# Patient Record
Sex: Female | Born: 1945 | Race: White | Hispanic: No | Marital: Married | Smoking: Former smoker
Health system: Southern US, Community
[De-identification: ages and names within clinical notes are randomized; demographics above are authoritative.]

## PROBLEM LIST (undated history)

## (undated) DIAGNOSIS — I219 Acute myocardial infarction, unspecified: Secondary | ICD-10-CM

## (undated) DIAGNOSIS — I10 Essential (primary) hypertension: Secondary | ICD-10-CM

## (undated) DIAGNOSIS — F32A Depression, unspecified: Secondary | ICD-10-CM

## (undated) DIAGNOSIS — F329 Major depressive disorder, single episode, unspecified: Secondary | ICD-10-CM

## (undated) DIAGNOSIS — I213 ST elevation (STEMI) myocardial infarction of unspecified site: Secondary | ICD-10-CM

## (undated) DIAGNOSIS — M1990 Unspecified osteoarthritis, unspecified site: Secondary | ICD-10-CM

## (undated) DIAGNOSIS — F419 Anxiety disorder, unspecified: Secondary | ICD-10-CM

## (undated) DIAGNOSIS — I447 Left bundle-branch block, unspecified: Secondary | ICD-10-CM

## (undated) DIAGNOSIS — G629 Polyneuropathy, unspecified: Secondary | ICD-10-CM

## (undated) DIAGNOSIS — N814 Uterovaginal prolapse, unspecified: Secondary | ICD-10-CM

## (undated) DIAGNOSIS — E785 Hyperlipidemia, unspecified: Secondary | ICD-10-CM

## (undated) DIAGNOSIS — I2510 Atherosclerotic heart disease of native coronary artery without angina pectoris: Secondary | ICD-10-CM

## (undated) DIAGNOSIS — N393 Stress incontinence (female) (male): Secondary | ICD-10-CM

## (undated) DIAGNOSIS — I6523 Occlusion and stenosis of bilateral carotid arteries: Secondary | ICD-10-CM

## (undated) DIAGNOSIS — I8390 Asymptomatic varicose veins of unspecified lower extremity: Secondary | ICD-10-CM

## (undated) DIAGNOSIS — G43909 Migraine, unspecified, not intractable, without status migrainosus: Secondary | ICD-10-CM

## (undated) HISTORY — PX: UMBILICAL HERNIA REPAIR: SHX196

## (undated) HISTORY — PX: CAROTID STENT: SHX1301

## (undated) HISTORY — PX: CHOLECYSTECTOMY: SHX55

## (undated) HISTORY — PX: HERNIA REPAIR: SHX51

## (undated) HISTORY — DX: Essential (primary) hypertension: I10

## (undated) HISTORY — DX: Acute myocardial infarction, unspecified: I21.9

## (undated) HISTORY — DX: Asymptomatic varicose veins of unspecified lower extremity: I83.90

## (undated) HISTORY — PX: CORONARY ANGIOPLASTY: SHX604

## (undated) HISTORY — PX: COLONOSCOPY: SHX174

---
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
## 2004-10-19 ENCOUNTER — Ambulatory Visit: Payer: Self-pay | Admitting: Unknown Physician Specialty

## 2005-12-13 ENCOUNTER — Ambulatory Visit: Payer: Self-pay | Admitting: Unknown Physician Specialty

## 2007-05-08 ENCOUNTER — Ambulatory Visit: Payer: Self-pay | Admitting: Internal Medicine

## 2008-05-11 ENCOUNTER — Ambulatory Visit: Payer: Self-pay | Admitting: Internal Medicine

## 2009-05-15 ENCOUNTER — Ambulatory Visit: Payer: Self-pay | Admitting: Internal Medicine

## 2010-08-17 ENCOUNTER — Ambulatory Visit: Payer: Self-pay | Admitting: Internal Medicine

## 2010-09-18 ENCOUNTER — Ambulatory Visit: Payer: Self-pay | Admitting: Unknown Physician Specialty

## 2011-08-26 ENCOUNTER — Ambulatory Visit: Payer: Self-pay | Admitting: Internal Medicine

## 2012-01-23 ENCOUNTER — Emergency Department: Payer: Self-pay | Admitting: Emergency Medicine

## 2012-01-23 DIAGNOSIS — I2510 Atherosclerotic heart disease of native coronary artery without angina pectoris: Secondary | ICD-10-CM | POA: Diagnosis present

## 2012-01-23 LAB — BASIC METABOLIC PANEL
Anion Gap: 9 (ref 7–16)
BUN: 17 mg/dL (ref 7–18)
Chloride: 104 mmol/L (ref 98–107)
Creatinine: 1.05 mg/dL (ref 0.60–1.30)
EGFR (Non-African Amer.): 55 — ABNORMAL LOW
Osmolality: 282 (ref 275–301)
Sodium: 140 mmol/L (ref 136–145)

## 2012-01-23 LAB — CBC
HCT: 43.2 % (ref 35.0–47.0)
HGB: 14.7 g/dL (ref 12.0–16.0)
MCHC: 34 g/dL (ref 32.0–36.0)
WBC: 7.5 10*3/uL (ref 3.6–11.0)

## 2012-01-23 LAB — CK TOTAL AND CKMB (NOT AT ARMC): CK-MB: 14.8 ng/mL — ABNORMAL HIGH (ref 0.5–3.6)

## 2012-03-23 ENCOUNTER — Encounter: Payer: Self-pay | Admitting: Cardiology

## 2012-04-01 ENCOUNTER — Encounter: Payer: Self-pay | Admitting: Cardiology

## 2012-05-02 ENCOUNTER — Encounter: Payer: Self-pay | Admitting: Cardiology

## 2012-05-30 ENCOUNTER — Encounter: Payer: Self-pay | Admitting: Cardiology

## 2012-06-09 DIAGNOSIS — R319 Hematuria, unspecified: Secondary | ICD-10-CM | POA: Diagnosis present

## 2012-09-01 ENCOUNTER — Ambulatory Visit: Payer: Self-pay | Admitting: Obstetrics & Gynecology

## 2012-09-15 ENCOUNTER — Ambulatory Visit: Payer: Self-pay | Admitting: Internal Medicine

## 2013-09-14 DIAGNOSIS — R7301 Impaired fasting glucose: Secondary | ICD-10-CM | POA: Diagnosis present

## 2013-09-14 DIAGNOSIS — F411 Generalized anxiety disorder: Secondary | ICD-10-CM | POA: Diagnosis present

## 2013-12-16 ENCOUNTER — Ambulatory Visit: Payer: Self-pay | Admitting: Family Medicine

## 2015-01-11 ENCOUNTER — Other Ambulatory Visit: Payer: Self-pay | Admitting: Family Medicine

## 2015-01-11 DIAGNOSIS — Z1231 Encounter for screening mammogram for malignant neoplasm of breast: Secondary | ICD-10-CM

## 2015-01-24 ENCOUNTER — Ambulatory Visit
Admission: RE | Admit: 2015-01-24 | Discharge: 2015-01-24 | Disposition: A | Discharge status: Home / Self Care | Payer: BLUE CROSS/BLUE SHIELD | Source: Ambulatory Visit | Attending: Family Medicine | Admitting: Family Medicine

## 2015-01-24 DIAGNOSIS — Z1231 Encounter for screening mammogram for malignant neoplasm of breast: Secondary | ICD-10-CM | POA: Insufficient documentation

## 2015-12-20 ENCOUNTER — Encounter (INDEPENDENT_AMBULATORY_CARE_PROVIDER_SITE_OTHER): Payer: Self-pay

## 2016-01-22 ENCOUNTER — Other Ambulatory Visit (INDEPENDENT_AMBULATORY_CARE_PROVIDER_SITE_OTHER): Payer: Self-pay | Admitting: Vascular Surgery

## 2016-01-22 DIAGNOSIS — I83813 Varicose veins of bilateral lower extremities with pain: Secondary | ICD-10-CM

## 2016-01-22 DIAGNOSIS — M79605 Pain in left leg: Secondary | ICD-10-CM

## 2016-01-22 DIAGNOSIS — I10 Essential (primary) hypertension: Secondary | ICD-10-CM

## 2016-01-22 DIAGNOSIS — M79604 Pain in right leg: Secondary | ICD-10-CM

## 2016-01-22 DIAGNOSIS — I25119 Atherosclerotic heart disease of native coronary artery with unspecified angina pectoris: Secondary | ICD-10-CM

## 2016-01-23 ENCOUNTER — Ambulatory Visit (INDEPENDENT_AMBULATORY_CARE_PROVIDER_SITE_OTHER): Payer: BLUE CROSS/BLUE SHIELD

## 2016-01-23 ENCOUNTER — Ambulatory Visit (INDEPENDENT_AMBULATORY_CARE_PROVIDER_SITE_OTHER): Payer: BLUE CROSS/BLUE SHIELD | Admitting: Vascular Surgery

## 2016-01-23 ENCOUNTER — Other Ambulatory Visit: Payer: Self-pay | Admitting: Family Medicine

## 2016-01-23 ENCOUNTER — Encounter (INDEPENDENT_AMBULATORY_CARE_PROVIDER_SITE_OTHER): Payer: Self-pay | Admitting: Vascular Surgery

## 2016-01-23 VITALS — BP 154/86 | HR 69 | Resp 17 | Ht 62.0 in | Wt 172.0 lb

## 2016-01-23 DIAGNOSIS — M79604 Pain in right leg: Secondary | ICD-10-CM

## 2016-01-23 DIAGNOSIS — I779 Disorder of arteries and arterioles, unspecified: Secondary | ICD-10-CM

## 2016-01-23 DIAGNOSIS — I739 Peripheral vascular disease, unspecified: Principal | ICD-10-CM

## 2016-01-23 DIAGNOSIS — I10 Essential (primary) hypertension: Secondary | ICD-10-CM

## 2016-01-23 DIAGNOSIS — M79605 Pain in left leg: Secondary | ICD-10-CM | POA: Diagnosis not present

## 2016-01-23 DIAGNOSIS — Z1231 Encounter for screening mammogram for malignant neoplasm of breast: Secondary | ICD-10-CM

## 2016-01-23 DIAGNOSIS — M79609 Pain in unspecified limb: Secondary | ICD-10-CM | POA: Insufficient documentation

## 2016-01-23 DIAGNOSIS — I25119 Atherosclerotic heart disease of native coronary artery with unspecified angina pectoris: Secondary | ICD-10-CM

## 2016-01-23 DIAGNOSIS — I83813 Varicose veins of bilateral lower extremities with pain: Secondary | ICD-10-CM | POA: Diagnosis not present

## 2016-01-23 DIAGNOSIS — M7989 Other specified soft tissue disorders: Secondary | ICD-10-CM | POA: Diagnosis not present

## 2016-01-23 NOTE — Assessment & Plan Note (Signed)
The patient's lower extremity duplex today demonstrates no evidence of deep venous thrombosis, superficial thrombophlebitis, or significant venous reflux in either lower extremity. A reasonably large Baker cyst was seen on the left, and recently she has been having more pain and fullness in her left calf area. It does not appear as if her lower extremity symptoms are related to venous disease. We have recommended continued leg elevation and compression stockings as needed for discomfort.

## 2016-01-23 NOTE — Patient Instructions (Signed)
°Carotid Artery Disease °The carotid arteries are the two main arteries on either side of the neck that supply blood to the brain. Carotid artery disease, also called carotid artery stenosis, is the narrowing or blockage of one or both carotid arteries. Carotid artery disease increases your risk for a stroke or a transient ischemic attack (TIA). A TIA is an episode in which a waxy, fatty substance that accumulates within the artery (plaque) blocks blood flow to the brain. A TIA is considered a "warning stroke."  °CAUSES  °· Buildup of plaque inside the carotid arteries (atherosclerosis) (common). °· A weakened outpouching in an artery (aneurysm). °· Inflammation of the carotid artery (arteritis). °· A fibrous growth within the carotid artery (fibromuscular dysplasia). °· Tissue death within the carotid artery due to radiation treatment (post-radiation necrosis). °· Decreased blood flow due to spasms of the carotid artery (vasospasm). °· Separation of the walls of the carotid artery (carotid dissection). °RISK FACTORS °· High cholesterol (dyslipidemia).   °· High blood pressure (hypertension).   °· Smoking.   °· Obesity.   °· Diabetes.   °· Family history of cardiovascular disease.   °· Inactivity or lack of regular exercise.   °· Being female. Men have an increased risk of developing atherosclerosis earlier in life than women.   °SYMPTOMS  °Carotid artery disease does not cause symptoms. °DIAGNOSIS °Diagnosis of carotid artery disease may include:  °· A physical exam. Your health care provider may hear an abnormal sound (bruit) when listening to the carotid arteries.   °· Specific tests that look at the blood flow in the carotid arteries. These tests include:   °¨ Carotid artery ultrasonography.   °¨ Carotid or cerebral angiography.   °¨ Computerized tomographic angiography (CTA).   °¨ Magnetic resonance angiography (MRA).   °TREATMENT  °Treatment of carotid artery disease can include a combination of treatments.  Treatment options include: °· Surgery. You may have:   °¨ A carotid endarterectomy. This is a surgery to remove the blockages in the carotid arteries.   °¨ A carotid angioplasty with stenting. This is a nonsurgical interventional procedure. A wire mesh (stent) is used to widen the blocked carotid arteries.   °· Medicines to control blood pressure, cholesterol, and reduce blood clotting (antiplatelet therapy).   °· Adjusting your diet.   °· Lifestyle changes such as:   °¨ Quitting smoking.   °¨ Exercising as tolerated or as directed by your health care provider.   °¨ Controlling and maintaining a good blood pressure.   °¨ Keeping cholesterol levels under control.   °HOME CARE INSTRUCTIONS  °· Take medicines only as directed by your health care provider. Make sure you understand all your medicine instructions. Do not stop your medicines without talking to your health care provider.   °· Follow your health care provider's diet instructions. It is important to eat a healthy diet that is low in saturated fats and includes plenty of fresh fruits, vegetables, and lean meats. High-fat, high-sodium foods as well as foods that are fried, overly processed, or have poor nutritional value should be avoided. °· Maintain a healthy weight.   °· Stay physically active. It is recommended that you get at least 30 minutes of activity every day.   °· Do not use any tobacco products including cigarettes, chewing tobacco, or electronic cigarettes. If you need help quitting, ask your health care provider. °· Limit alcohol use to:   °¨ No more than 2 drinks per day for men.   °¨ No more than 1 drink per day for nonpregnant women.   °· Do not use illegal drugs.   °· Keep all follow-up visits as directed by your health   care provider.   °SEEK IMMEDIATE MEDICAL CARE IF:  °You develop TIA or stroke symptoms. These include:  °· Sudden weakness or numbness on one side of the body, such as in the face, arm, or leg.   °· Sudden confusion.    °· Trouble speaking (aphasia) or understanding.   °· Sudden trouble seeing out of one or both eyes.   °· Sudden trouble walking.   °· Dizziness or feeling like you might faint.   °· Loss of balance or coordination.   °· Sudden severe headache with no known cause.   °· Sudden trouble swallowing (dysphagia).   °If you have any of these symptoms, call your local emergency services (911 in [place] Do not drive yourself to the clinic or hospital. This is a medical emergency.  °  °This information is not intended to replace advice given to you by your health care provider. Make sure you discuss any questions you have with your health care provider. °  °Document Released: 06/10/2011 Document Revised: 04/08/2014 Document Reviewed: 09/16/2012 °Elsevier Interactive Patient Education ©2016 [employer] ° °

## 2016-01-23 NOTE — Progress Notes (Signed)
MRN : [id number]  Chelsea Fitzgerald is a 70 y.o. (06/17/1945) female who presents with chief complaint of  Chief Complaint  Patient presents with  . Carotid    Ultrasound follow  .  History of Present Illness: Patient returns today in follow up Today with her vascular studies. The patient's lower extremity duplex today demonstrates no evidence of deep venous thrombosis, superficial thrombophlebitis, or significant venous reflux in either lower extremity. A reasonably large Baker cyst was seen on the left, and recently she has been having more pain and fullness in her left calf area. She does not have ulceration or infection. Her carotid duplex today revealed 40-59% right ICA stenosis and 1-39% left ICA stenosis. She denies any focal neurologic symptoms. Specifically, the patient denies amaurosis fugax, speech or swallowing difficulties, or arm or leg weakness or numbness   Current Outpatient Prescriptions  Medication Sig Dispense Refill  . ALPRAZolam (XANAX) 0.5 MG tablet Take by mouth.    Marland Kitchen aspirin EC 81 MG tablet Take by mouth.    . Cholecalciferol (VITAMIN D-1000 MAX ST) 1000 units tablet Take by mouth.    . citalopram (CELEXA) 40 MG tablet Take 40 mg by mouth daily.  3  . conjugated estrogens (PREMARIN) vaginal cream INSERT 1/4 APPLICATORFUL VAGINALLY TWICE A WEEK AS DIRECTED    . fluticasone (FLONASE) 50 MCG/ACT nasal spray USE 1 SPRAY EACH NOSTRIL ONCE A DAY    . loratadine (CLARITIN) 10 MG tablet Take 10 mg by mouth daily.    Marland Kitchen losartan-hydrochlorothiazide (HYZAAR) 50-12.5 MG tablet TAKE 1 TABLET BY MOUTH ONCE DAILY.    . Melatonin 3 MG TABS Take by mouth.    . metoprolol succinate (TOPROL-XL) 50 MG 24 hr tablet TAKE 1 TABLET (50 MG TOTAL) BY MOUTH ONCE DAILY.    . montelukast (SINGULAIR) 10 MG tablet Take 10 mg by mouth at bedtime.    . Multiple Vitamin (MULTIVITAMIN) capsule Take by mouth.    . Omega-3 1000 MG CAPS Take by mouth.    Marland Kitchen oxymetazoline (AFRIN) 0.05 % nasal spray  Place 1 spray into both nostrils 2 (two) times daily.    . rosuvastatin (CRESTOR) 5 MG tablet Take 5 mg by mouth daily.    . vitamin B-12 (CYANOCOBALAMIN) 100 MCG tablet Take 100 mcg by mouth daily.     No current facility-administered medications for this visit.     Past Medical History:  Diagnosis Date  . Hypertension   . Myocardial infarction   . Varicose veins     Past Surgical History:  Procedure Laterality Date  . CHOLECYSTECTOMY    . HERNIA REPAIR      Social History Social History  Substance Use Topics  . Smoking status: Never Smoker  . Smokeless tobacco: Not on file  . Alcohol use Yes      Family History Family History  Problem Relation Age of Onset  . Breast cancer Sister 26  . Cancer Father   . Hypertension Father      Allergies  Allergen Reactions  . Ace Inhibitors Other (See Comments)    Mouth ulcers  . Lipitor [Atorvastatin]   . Lisinopril   . Simvastatin   . Statins      REVIEW OF SYSTEMS (Negative unless checked)  Constitutional: [] Weight loss  [] Fever  [] Chills Cardiac: [] Chest pain   [] Chest pressure   [] Palpitations   [] Shortness of breath when laying flat   [] Shortness of breath at rest   [] Shortness of breath with  exertion. Vascular:  [] Pain in legs with walking   [x] Pain in legs at rest   [] Pain in legs when laying flat   [] Claudication   [] Pain in feet when walking  [] Pain in feet at rest  [] Pain in feet when laying flat   [] History of DVT   [] Phlebitis   [x] Swelling in legs   [] Varicose veins   [] Non-healing ulcers Pulmonary:   [] Uses home oxygen   [] Productive cough   [] Hemoptysis   [] Wheeze  [] COPD   [] Asthma Neurologic:  [] Dizziness  [] Blackouts   [] Seizures   [] History of stroke   [] History of TIA  [] Aphasia   [] Temporary blindness   [] Dysphagia   [] Weakness or numbness in arms   [] Weakness or numbness in legs Musculoskeletal:  [] Arthritis   [] Joint swelling   [] Joint pain   [] Low back pain Hematologic:  [] Easy bruising  [] Easy  bleeding   [] Hypercoagulable state   [] Anemic   Gastrointestinal:  [] Blood in stool   [] Vomiting blood  [] Gastroesophageal reflux/heartburn   [] Abdominal pain Genitourinary:  [] Chronic kidney disease   [] Difficult urination  [] Frequent urination  [] Burning with urination   [] Hematuria Skin:  [] Rashes   [] Ulcers   [] Wounds Psychological:  [] History of anxiety   []  History of major depression.  Physical Examination  BP (!) 154/86   Pulse 69   Resp 17   Ht 5\' 2"  (1.575 m)   Wt 172 lb (78 kg)   BMI 31.46 kg/m  Gen:  WD/WN, NAD Head: [employer]/AT, No temporalis wasting. Ear/Nose/Throat: Hearing grossly intact, nares w/o erythema or drainage, trachea midline Eyes: Conjunctiva clear. Sclera non-icteric Neck: Supple.  No JVD.  Pulmonary:  Good air movement, no use of accessory muscles.  Cardiac: RRR, normal S1, S2 Vascular:  Vessel Right Left  Radial Palpable Palpable                                   Gastrointestinal: soft, non-tender/non-distended. No guarding/reflex.  Musculoskeletal: M/S 5/5 throughout.  No deformity or atrophy. Trace right lower extremity edema and 1+ left lower extremity edema. Diffuse varicosities present bilaterally Neurologic: Sensation grossly intact in extremities.  Symmetrical.  Speech is fluent.  Psychiatric: Judgment intact, Mood & affect appropriate for pt's clinical situation. Dermatologic: No rashes or ulcers noted.  No cellulitis or open wounds. Lymph : No Cervical, Axillary, or Inguinal lymphadenopathy.      Labs No results found for this or any previous visit (from the past 2160 hour(s)).  Radiology No results found.    Assessment/Plan  Carotid disease, bilateral ([place]) The patient had a duplex today which revealed 40-59% right ICA stenosis and 1-39% left ICA stenosis.  This level of disease is low risk for stroke.  She should continue ASA and Crestor.  Plan follow up duplex in one year.    Essential hypertension blood pressure control  important in reducing the progression of atherosclerotic disease. On appropriate oral medications.   Pain in limb The patient's lower extremity duplex today demonstrates no evidence of deep venous thrombosis, superficial thrombophlebitis, or significant venous reflux in either lower extremity. A reasonably large Baker cyst was seen on the left, and recently she has been having more pain and fullness in her left calf area. It does not appear as if her lower extremity symptoms are related to venous disease. We have recommended continued leg elevation and compression stockings as needed for discomfort.  Swelling of limb The  patient's lower extremity duplex today demonstrates no evidence of deep venous thrombosis, superficial thrombophlebitis, or significant venous reflux in either lower extremity. A reasonably large Baker cyst was seen on the left, and recently she has been having more pain and fullness in her left calf area. It does not appear as if her lower extremity symptoms are related to venous disease. We have recommended continued leg elevation and compression stockings as needed for discomfort.    Leotis Pain, MD  01/23/2016 2:42 PM    This note was created with Dragon medical transcription system.  Any errors from dictation are purely unintentional

## 2016-01-23 NOTE — Assessment & Plan Note (Signed)
blood pressure control important in reducing the progression of atherosclerotic disease. On appropriate oral medications.  

## 2016-01-23 NOTE — Assessment & Plan Note (Signed)
The patient had a duplex today which revealed 40-59% right ICA stenosis and 1-39% left ICA stenosis.  This level of disease is low risk for stroke.  She should continue ASA and Crestor.  Plan follow up duplex in one year.

## 2016-02-27 ENCOUNTER — Ambulatory Visit
Admission: RE | Admit: 2016-02-27 | Discharge: 2016-02-27 | Disposition: A | Discharge status: Home / Self Care | Payer: BLUE CROSS/BLUE SHIELD | Source: Ambulatory Visit | Attending: Family Medicine | Admitting: Family Medicine

## 2016-02-27 DIAGNOSIS — R928 Other abnormal and inconclusive findings on diagnostic imaging of breast: Secondary | ICD-10-CM | POA: Insufficient documentation

## 2016-02-27 DIAGNOSIS — Z1231 Encounter for screening mammogram for malignant neoplasm of breast: Secondary | ICD-10-CM | POA: Insufficient documentation

## 2016-02-29 ENCOUNTER — Other Ambulatory Visit: Payer: Self-pay | Admitting: Family Medicine

## 2016-02-29 DIAGNOSIS — R928 Other abnormal and inconclusive findings on diagnostic imaging of breast: Secondary | ICD-10-CM

## 2016-03-12 ENCOUNTER — Ambulatory Visit
Admission: RE | Admit: 2016-03-12 | Discharge: 2016-03-12 | Disposition: A | Discharge status: Home / Self Care | Payer: BLUE CROSS/BLUE SHIELD | Source: Ambulatory Visit | Attending: Family Medicine | Admitting: Family Medicine

## 2016-03-12 ENCOUNTER — Other Ambulatory Visit: Payer: Self-pay | Admitting: Family Medicine

## 2016-03-12 DIAGNOSIS — R928 Other abnormal and inconclusive findings on diagnostic imaging of breast: Secondary | ICD-10-CM

## 2016-03-12 DIAGNOSIS — N6320 Unspecified lump in the left breast, unspecified quadrant: Secondary | ICD-10-CM

## 2016-03-12 DIAGNOSIS — N6324 Unspecified lump in the left breast, lower inner quadrant: Secondary | ICD-10-CM | POA: Insufficient documentation

## 2016-03-20 ENCOUNTER — Ambulatory Visit
Admission: RE | Admit: 2016-03-20 | Discharge: 2016-03-20 | Disposition: A | Discharge status: Home / Self Care | Payer: BLUE CROSS/BLUE SHIELD | Source: Ambulatory Visit | Attending: Family Medicine | Admitting: Family Medicine

## 2016-03-20 DIAGNOSIS — R928 Other abnormal and inconclusive findings on diagnostic imaging of breast: Secondary | ICD-10-CM | POA: Diagnosis present

## 2016-03-20 DIAGNOSIS — N6320 Unspecified lump in the left breast, unspecified quadrant: Secondary | ICD-10-CM | POA: Insufficient documentation

## 2016-03-20 HISTORY — PX: BREAST BIOPSY: SHX20

## 2016-03-21 LAB — SURGICAL PATHOLOGY

## 2016-09-18 ENCOUNTER — Other Ambulatory Visit: Payer: Self-pay | Admitting: Family Medicine

## 2016-09-18 DIAGNOSIS — N6320 Unspecified lump in the left breast, unspecified quadrant: Secondary | ICD-10-CM

## 2016-10-18 ENCOUNTER — Ambulatory Visit
Admission: RE | Admit: 2016-10-18 | Discharge: 2016-10-18 | Disposition: A | Discharge status: Home / Self Care | Payer: BLUE CROSS/BLUE SHIELD | Source: Ambulatory Visit | Attending: Family Medicine | Admitting: Family Medicine

## 2016-10-18 DIAGNOSIS — N6320 Unspecified lump in the left breast, unspecified quadrant: Secondary | ICD-10-CM

## 2017-01-28 ENCOUNTER — Encounter (INDEPENDENT_AMBULATORY_CARE_PROVIDER_SITE_OTHER): Payer: Self-pay

## 2017-01-28 ENCOUNTER — Ambulatory Visit (INDEPENDENT_AMBULATORY_CARE_PROVIDER_SITE_OTHER): Payer: Self-pay | Admitting: Vascular Surgery

## 2017-05-06 ENCOUNTER — Other Ambulatory Visit: Payer: Self-pay | Admitting: Family Medicine

## 2017-05-06 DIAGNOSIS — Z1231 Encounter for screening mammogram for malignant neoplasm of breast: Secondary | ICD-10-CM

## 2017-05-20 ENCOUNTER — Ambulatory Visit
Admission: RE | Admit: 2017-05-20 | Discharge: 2017-05-20 | Disposition: A | Discharge status: Home / Self Care | Payer: BLUE CROSS/BLUE SHIELD | Source: Ambulatory Visit | Attending: Family Medicine | Admitting: Family Medicine

## 2017-05-20 DIAGNOSIS — Z1231 Encounter for screening mammogram for malignant neoplasm of breast: Secondary | ICD-10-CM | POA: Diagnosis not present

## 2017-05-21 ENCOUNTER — Other Ambulatory Visit: Payer: Self-pay | Admitting: Family Medicine

## 2017-05-21 DIAGNOSIS — R921 Mammographic calcification found on diagnostic imaging of breast: Secondary | ICD-10-CM

## 2017-05-21 DIAGNOSIS — R928 Other abnormal and inconclusive findings on diagnostic imaging of breast: Secondary | ICD-10-CM

## 2017-05-27 ENCOUNTER — Ambulatory Visit
Admission: RE | Admit: 2017-05-27 | Discharge: 2017-05-27 | Disposition: A | Discharge status: Home / Self Care | Payer: BLUE CROSS/BLUE SHIELD | Source: Ambulatory Visit | Attending: Family Medicine | Admitting: Family Medicine

## 2017-05-27 DIAGNOSIS — R928 Other abnormal and inconclusive findings on diagnostic imaging of breast: Secondary | ICD-10-CM

## 2017-05-27 DIAGNOSIS — R921 Mammographic calcification found on diagnostic imaging of breast: Secondary | ICD-10-CM | POA: Diagnosis present

## 2017-05-28 ENCOUNTER — Other Ambulatory Visit: Payer: Self-pay | Admitting: Family Medicine

## 2017-05-28 DIAGNOSIS — R921 Mammographic calcification found on diagnostic imaging of breast: Secondary | ICD-10-CM

## 2017-05-28 DIAGNOSIS — R928 Other abnormal and inconclusive findings on diagnostic imaging of breast: Secondary | ICD-10-CM

## 2017-05-29 ENCOUNTER — Ambulatory Visit
Admission: RE | Admit: 2017-05-29 | Discharge: 2017-05-29 | Disposition: A | Discharge status: Home / Self Care | Payer: BLUE CROSS/BLUE SHIELD | Source: Ambulatory Visit | Attending: Family Medicine | Admitting: Family Medicine

## 2017-05-29 DIAGNOSIS — R921 Mammographic calcification found on diagnostic imaging of breast: Secondary | ICD-10-CM

## 2017-05-29 DIAGNOSIS — R928 Other abnormal and inconclusive findings on diagnostic imaging of breast: Secondary | ICD-10-CM

## 2017-05-29 HISTORY — PX: BREAST BIOPSY: SHX20

## 2017-06-02 LAB — SURGICAL PATHOLOGY

## 2017-08-22 IMAGING — MG MM DIGITAL DIAGNOSTIC UNILAT*L* W/ TOMO W/ CAD
9 series · 9 of 21 positions shown · non-contrast
Comparison: Previous exam(s).

CLINICAL DATA: The patient was called back for a cluster of right
breast masses.

EXAM:
2D DIGITAL DIAGNOSTIC RIGHT MAMMOGRAM WITH CAD AND ADJUNCT TOMO
ULTRASOUND RIGHT BREAST

[L MLO (1 of 2)]
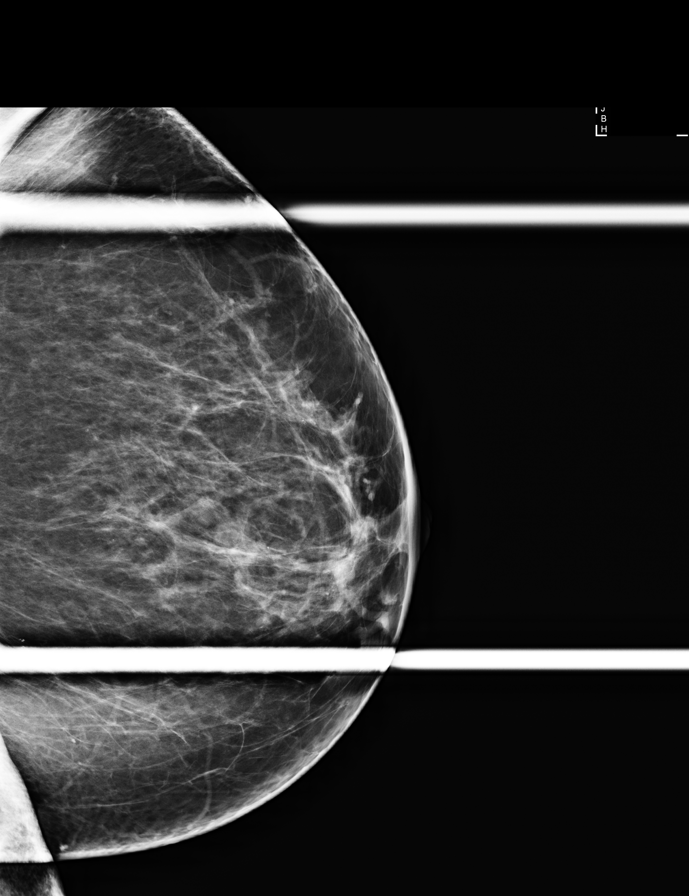

[L CC]
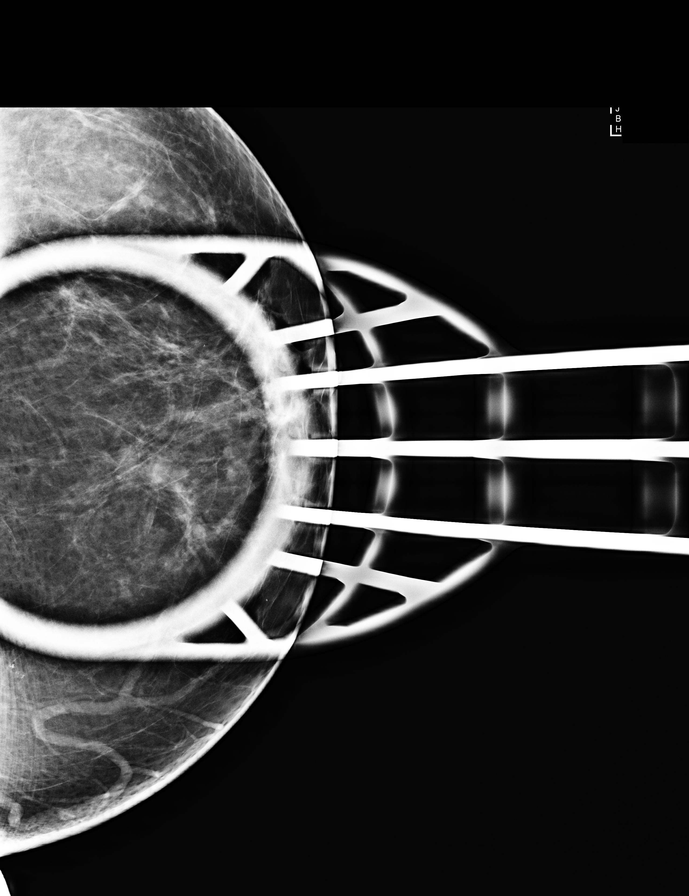

[L MLO synth-2D (1 of 2)]
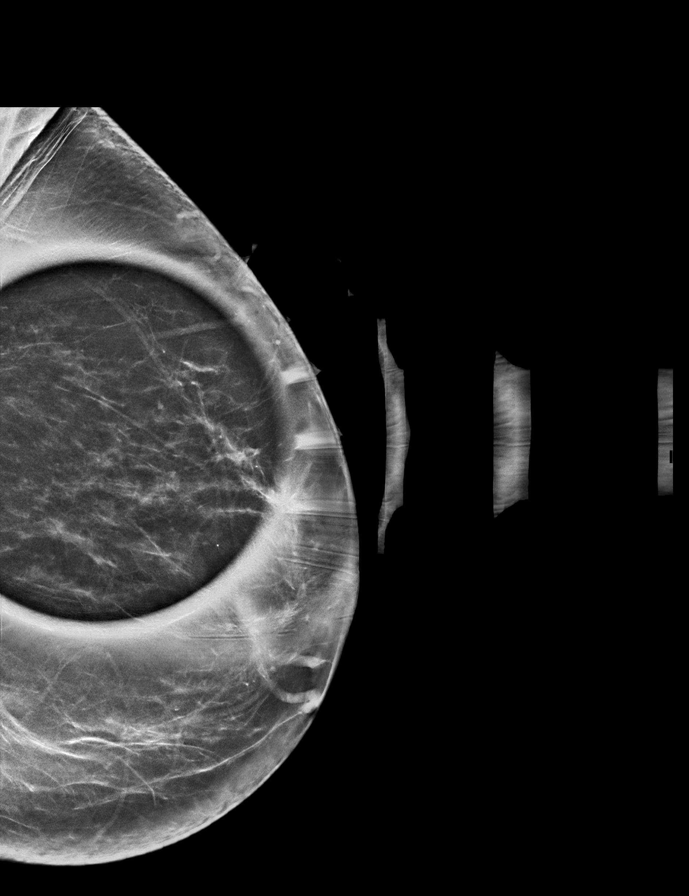

[L CC synth-2D]
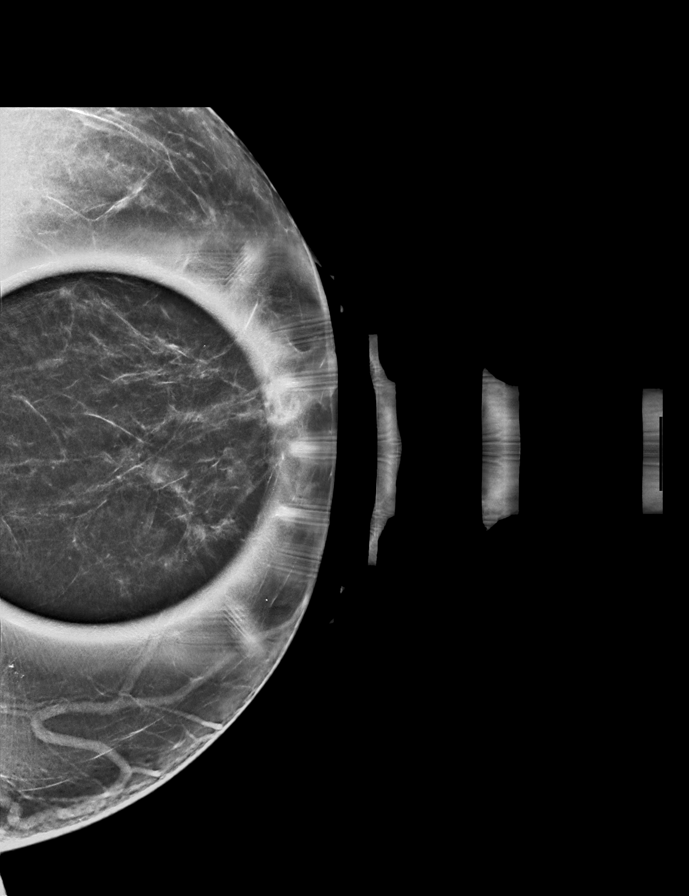

[L MLO synth-2D (2 of 2)]
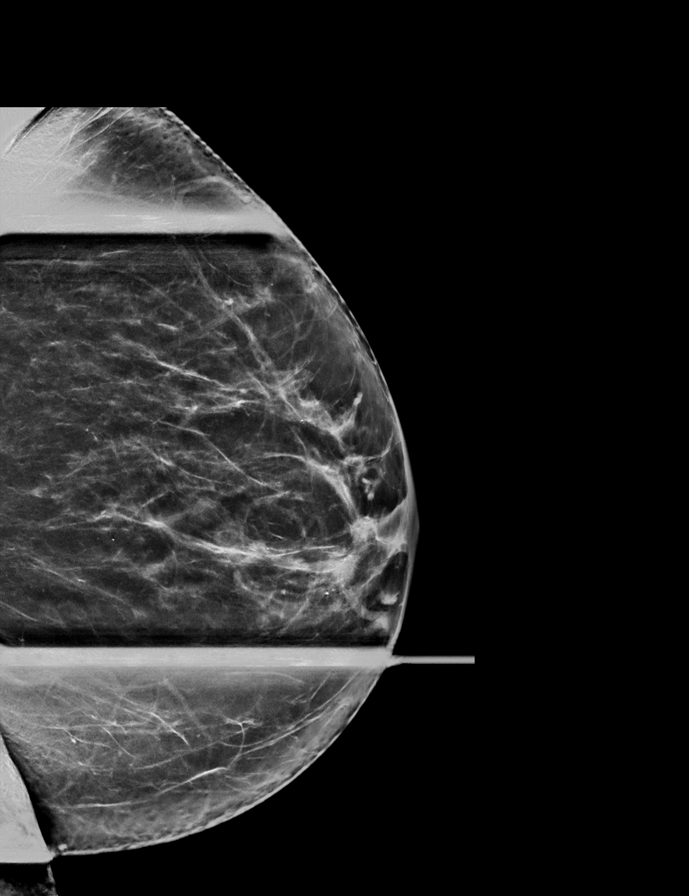

[L MLO (2 of 2)]
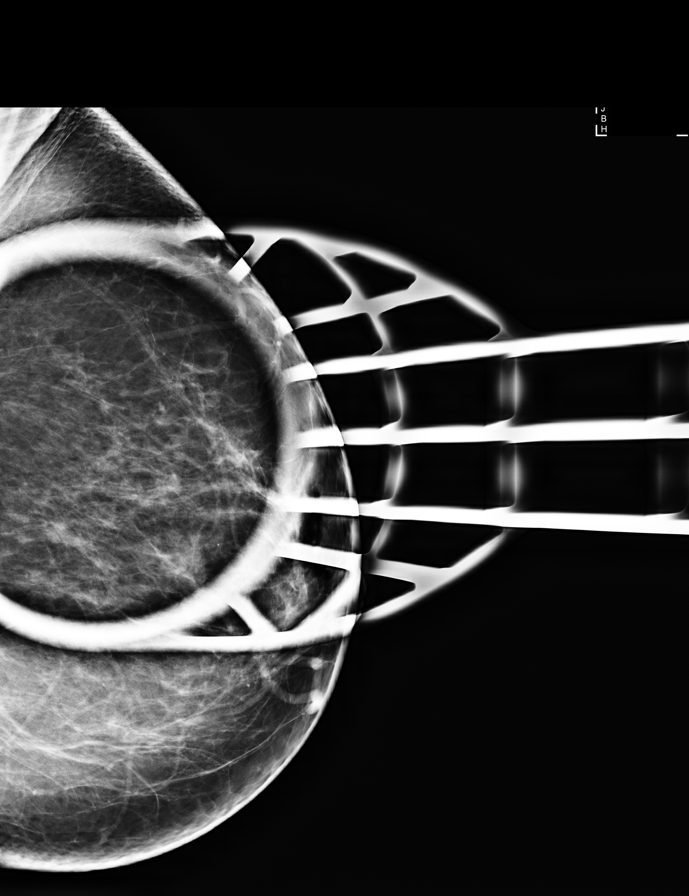

[L MLO tomo (1 of 2) · tomo slice 37/72.0]
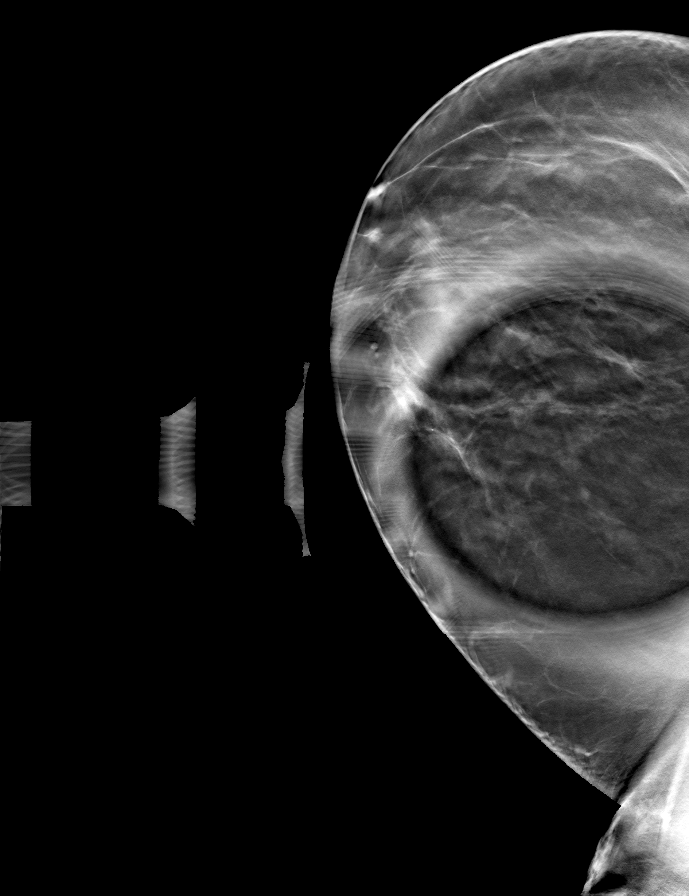

[L CC tomo · tomo slice 31/60.0]
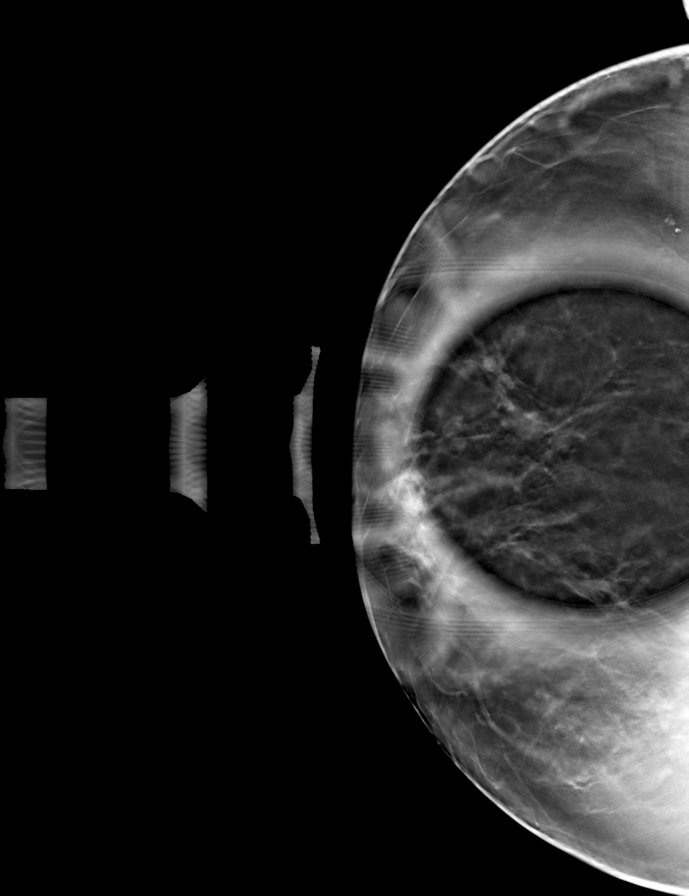

[L MLO tomo (2 of 2) · tomo slice 39/77.0]
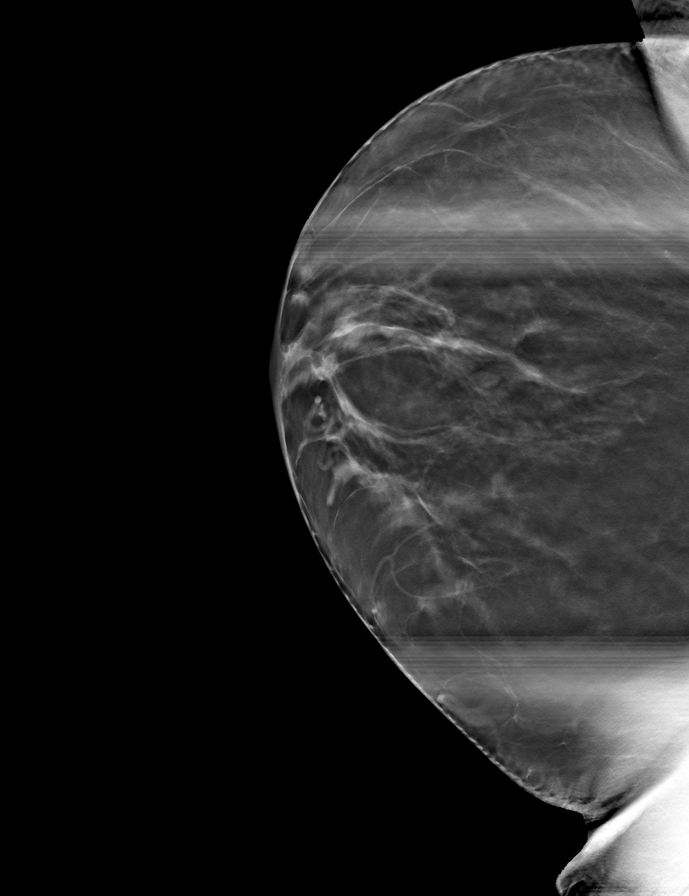

[9 of 21 positions shown; findings below may reference images not displayed]

ACR Breast Density Category b: There are scattered areas of
fibroglandular density.
FINDINGS: The cluster of right breast masses persist on additional imaging. It
is actually most conspicuous on the original study. This is a change
since comparison studies.

Mammographic images were processed with CAD.

On physical exam, no suspicious lumps are identified.

Targeted ultrasound is performed, showing a complex mass at [DATE], 2
cm from the nipple correlating with the mammographic findings. The
mass measures 1.5 x 0.8 x 0.7 cm. No axillary adenopathy.
IMPRESSION: Indeterminate mass in the left breast. The patient does use an
estrogen cream approximately 2 days a week. However, there has been
no change in usage.

RECOMMENDATION:
Ultrasound-guided biopsy of the left breast mass.

I have discussed the findings and recommendations with the patient.
Results were also provided in writing at the conclusion of the
visit. If applicable, a reminder letter will be sent to the patient
regarding the next appointment.

BI-RADS CATEGORY  4: Suspicious.

## 2018-04-23 ENCOUNTER — Other Ambulatory Visit: Payer: Self-pay | Admitting: Family Medicine

## 2018-04-23 DIAGNOSIS — R928 Other abnormal and inconclusive findings on diagnostic imaging of breast: Secondary | ICD-10-CM

## 2018-05-22 ENCOUNTER — Ambulatory Visit
Admission: RE | Admit: 2018-05-22 | Discharge: 2018-05-22 | Disposition: A | Discharge status: Home / Self Care | Payer: BLUE CROSS/BLUE SHIELD | Source: Ambulatory Visit | Attending: Family Medicine | Admitting: Family Medicine

## 2018-05-22 DIAGNOSIS — R928 Other abnormal and inconclusive findings on diagnostic imaging of breast: Secondary | ICD-10-CM | POA: Diagnosis present

## 2018-12-28 DIAGNOSIS — I447 Left bundle-branch block, unspecified: Secondary | ICD-10-CM

## 2018-12-28 HISTORY — DX: Left bundle-branch block, unspecified: I44.7

## 2019-01-08 ENCOUNTER — Encounter (HOSPITAL_COMMUNITY): Payer: Self-pay

## 2019-01-08 ENCOUNTER — Other Ambulatory Visit: Payer: Self-pay | Admitting: Cardiology

## 2019-01-08 DIAGNOSIS — I25118 Atherosclerotic heart disease of native coronary artery with other forms of angina pectoris: Secondary | ICD-10-CM

## 2019-01-08 NOTE — Patient Instructions (Addendum)
DUE TO COVID-19 ONLY ONE VISITOR IS ALLOWED TO COME WITH YOU AND STAY IN THE WAITING ROOM ONLY DURING PRE OP AND PROCEDURE. THE ONE VISITOR MAY VISIT WITH YOU IN YOUR PRIVATE ROOM DURING VISITING HOURS ONLY!!   COVID SWAB TESTING MUST BE COMPLETED ON:  Thursday, Oct. 15, 2020 at    [facility] Entrance [place]. (Must self quarantine after testing. Follow instructions on handout.)       Your procedure is scheduled on: Monday, Oct. 19, 2020   Report to [facility] Main  Entrance    Report to admitting at 11:08 AM   Call this number if you have problems the morning of surgery [phone]   Do not eat food:After Midnight.   May have liquids until 10:30AM day of surgery. Complete one Ensure drink the morning of surgery at  10:30AM the day of surgery.   CLEAR LIQUID DIET  Foods Allowed                                                                     Foods Excluded  Water, Black Coffee and tea, regular and decaf                             liquids that you cannot  Plain Jell-O in any flavor  (No red)                                           see through such as: Fruit ices (not with fruit pulp)                                     milk, soups, orange juice  Iced Popsicles (No red)                                    All solid food Carbonated beverages, regular and diet                                    Apple juices Sports drinks like Gatorade (No red) Lightly seasoned clear broth or consume(fat free) Sugar, honey syrup       Brush your teeth the morning of surgery.     Take these medicines the morning of surgery with A SIP OF WATER: Citalopram, Metoprolol, Rosuvastatin, Trimethoprim   May use Flonase day of surgery                               You may not have any metal on your body including hair pins, jewelry, and body piercings             Do not wear make-up, lotions, powders, perfumes or deodorant             Do not  wear nail polish on fingernails.  Do not shave  48 hours prior to surgery.               Do not bring valuables to the hospital. [place].   Contacts, dentures or bridgework may not be worn into surgery.   Bring small overnight bag day of surgery.    Special Instructions: Bring a copy of your healthcare power of attorney and living will documents         the day of surgery if you haven't scanned them in before.              Please read over the following fact sheets you were given:  [facility] - Preparing for Surgery Before surgery, you can play an important role.  Because skin is not sterile, your skin needs to be as free of germs as possible.  You can reduce the number of germs on your skin by washing with CHG (chlorahexidine gluconate) soap before surgery.  CHG is an antiseptic cleaner which kills germs and bonds with the skin to continue killing germs even after washing. Please DO NOT use if you have an allergy to CHG or antibacterial soaps.  If your skin becomes reddened/irritated stop using the CHG and inform your nurse when you arrive at Short Stay. Do not shave (including legs and underarms) for at least 48 hours prior to the first CHG shower.  You may shave your face/neck.  Please follow these instructions carefully:  1.  Shower with CHG Soap the night before surgery and the  morning of surgery.  2.  If you choose to wash your hair, wash your hair first as usual with your normal  shampoo.  3.  After you shampoo, rinse your hair and body thoroughly to remove the shampoo.                             4.  Use CHG as you would any other liquid soap.  You can apply chg directly to the skin and wash.  Gently with a scrungie or clean washcloth.  5.  Apply the CHG Soap to your body ONLY FROM THE NECK DOWN.   Do   not use on face/ open                           Wound or open sores. Avoid contact with eyes, ears mouth and   genitals (private  parts).                       Wash face,  Genitals (private parts) with your normal soap.             6.  Wash thoroughly, paying special attention to the area where your    surgery  will be performed.  7.  Thoroughly rinse your body with warm water from the neck down.  8.  DO NOT shower/wash with your normal soap after using and rinsing off the CHG Soap.                9.  Pat yourself dry with a clean towel.            10.  Wear clean pajamas.  11.  Place clean sheets on your bed the night of your first shower and do not  sleep with pets. Day of Surgery : Do not apply any lotions/deodorants the morning of surgery.  Please wear clean clothes to the hospital/surgery center.  FAILURE TO FOLLOW THESE INSTRUCTIONS MAY RESULT IN THE CANCELLATION OF YOUR SURGERY  PATIENT SIGNATURE_________________________________  NURSE SIGNATURE__________________________________  ________________________________________________________________________   Chelsea Fitzgerald  An incentive spirometer is a tool that can help keep your lungs clear and active. This tool measures how well you are filling your lungs with each breath. Taking long deep breaths may help reverse or decrease the chance of developing breathing (pulmonary) problems (especially infection) following:  A long period of time when you are unable to move or be active. BEFORE THE PROCEDURE   If the spirometer includes an indicator to show your best effort, your nurse or respiratory therapist will set it to a desired goal.  If possible, sit up straight or lean slightly forward. Try not to slouch.  Hold the incentive spirometer in an upright position. INSTRUCTIONS FOR USE  1. Sit on the edge of your bed if possible, or sit up as far as you can in bed or on a chair. 2. Hold the incentive spirometer in an upright position. 3. Breathe out normally. 4. Place the mouthpiece in your mouth and seal your lips tightly around  it. 5. Breathe in slowly and as deeply as possible, raising the piston or the ball toward the top of the column. 6. Hold your breath for 3-5 seconds or for as long as possible. Allow the piston or ball to fall to the bottom of the column. 7. Remove the mouthpiece from your mouth and breathe out normally. 8. Rest for a few seconds and repeat Steps 1 through 7 at least 10 times every 1-2 hours when you are awake. Take your time and take a few normal breaths between deep breaths. 9. The spirometer may include an indicator to show your best effort. Use the indicator as a goal to work toward during each repetition. 10. After each set of 10 deep breaths, practice coughing to be sure your lungs are clear. If you have an incision (the cut made at the time of surgery), support your incision when coughing by placing a pillow or rolled up towels firmly against it. Once you are able to get out of bed, walk around indoors and cough well. You may stop using the incentive spirometer when instructed by your caregiver.  RISKS AND COMPLICATIONS  Take your time so you do not get dizzy or light-headed.  If you are in pain, you may need to take or ask for pain medication before doing incentive spirometry. It is harder to take a deep breath if you are having pain. AFTER USE  Rest and breathe slowly and easily.  It can be helpful to keep track of a log of your progress. Your caregiver can provide you with a simple table to help with this. If you are using the spirometer at home, follow these instructions: [place] IF:   You are having difficultly using the spirometer.  You have trouble using the spirometer as often as instructed.  Your pain medication is not giving enough relief while using the spirometer.  You develop fever of 100.5 F (38.1 C) or higher. SEEK IMMEDIATE MEDICAL CARE IF:   You cough up bloody sputum that had not been present before.  You develop fever of 102 F (38.9 C)  or  greater.  You develop worsening pain at or near the incision site. MAKE SURE YOU:   Understand these instructions.  Will watch your condition.  Will get help right away if you are not doing well or get worse. Document Released: 07/29/2006 Document Revised: 06/10/2011 Document Reviewed: 09/29/2006 Exit[employer] Patient Information 2014 [employer], [place].   ________________________________________________________________________  WHAT IS A BLOOD TRANSFUSION? Blood Transfusion Information  A transfusion is the replacement of blood or some of its parts. Blood is made up of multiple cells which provide different functions.  Red blood cells carry oxygen and are used for blood loss replacement.  White blood cells fight against infection.  Platelets control bleeding.  Plasma helps clot blood.  Other blood products are available for specialized needs, such as hemophilia or other clotting disorders. BEFORE THE TRANSFUSION  Who gives blood for transfusions?   Healthy volunteers who are fully evaluated to make sure their blood is safe. This is blood bank blood. Transfusion therapy is the safest it has ever been in the practice of medicine. Before blood is taken from a donor, a complete history is taken to make sure that person has no history of diseases nor engages in risky social behavior (examples are intravenous drug use or sexual activity with multiple partners). The donor's travel history is screened to minimize risk of transmitting infections, such as malaria. The donated blood is tested for signs of infectious diseases, such as HIV and hepatitis. The blood is then tested to be sure it is compatible with you in order to minimize the chance of a transfusion reaction. If you or a relative donates blood, this is often done in anticipation of surgery and is not appropriate for emergency situations. It takes many days to process the donated blood. RISKS AND COMPLICATIONS Although transfusion therapy  is very safe and saves many lives, the main dangers of transfusion include:   Getting an infectious disease.  Developing a transfusion reaction. This is an allergic reaction to something in the blood you were given. Every precaution is taken to prevent this. The decision to have a blood transfusion has been considered carefully by your caregiver before blood is given. Blood is not given unless the benefits outweigh the risks. AFTER THE TRANSFUSION  Right after receiving a blood transfusion, you will usually feel much better and more energetic. This is especially true if your red blood cells have gotten low (anemic). The transfusion raises the level of the red blood cells which carry oxygen, and this usually causes an energy increase.  The nurse administering the transfusion will monitor you carefully for complications. HOME CARE INSTRUCTIONS  No special instructions are needed after a transfusion. You may find your energy is better. Speak with your caregiver about any limitations on activity for underlying diseases you may have. SEEK MEDICAL CARE IF:   Your condition is not improving after your transfusion.  You develop redness or irritation at the intravenous (IV) site. SEEK IMMEDIATE MEDICAL CARE IF:  Any of the following symptoms occur over the next 12 hours:  Shaking chills.  You have a temperature by mouth above 102 F (38.9 C), not controlled by medicine.  Chest, back, or muscle pain.  People around you feel you are not acting correctly or are confused.  Shortness of breath or difficulty breathing.  Dizziness and fainting.  You get a rash or develop hives.  You have a decrease in urine output.  Your urine turns a dark color or changes to pink, red,  or brown. Any of the following symptoms occur over the next 10 days:  You have a temperature by mouth above 102 F (38.9 C), not controlled by medicine.  Shortness of breath.  Weakness after normal activity.  The white  part of the eye turns yellow (jaundice).  You have a decrease in the amount of urine or are urinating less often.  Your urine turns a dark color or changes to pink, red, or brown. Document Released: 03/15/2000 Document Revised: 06/10/2011 Document Reviewed: 11/02/2007 [facility] Patient Information 2014 [place], [place].  _______________________________________________________________________

## 2019-01-11 ENCOUNTER — Other Ambulatory Visit: Payer: Self-pay

## 2019-01-11 ENCOUNTER — Encounter (HOSPITAL_COMMUNITY): Payer: Self-pay

## 2019-01-11 ENCOUNTER — Encounter (HOSPITAL_COMMUNITY)
Admission: RE | Admit: 2019-01-11 | Discharge: 2019-01-11 | Disposition: A | Discharge status: Home / Self Care | Payer: BC Managed Care – PPO | Source: Ambulatory Visit | Attending: Orthopedic Surgery | Admitting: Orthopedic Surgery

## 2019-01-11 DIAGNOSIS — Z79899 Other long term (current) drug therapy: Secondary | ICD-10-CM | POA: Diagnosis not present

## 2019-01-11 DIAGNOSIS — Z7982 Long term (current) use of aspirin: Secondary | ICD-10-CM | POA: Diagnosis not present

## 2019-01-11 DIAGNOSIS — Z87891 Personal history of nicotine dependence: Secondary | ICD-10-CM | POA: Diagnosis not present

## 2019-01-11 DIAGNOSIS — I2510 Atherosclerotic heart disease of native coronary artery without angina pectoris: Secondary | ICD-10-CM | POA: Insufficient documentation

## 2019-01-11 DIAGNOSIS — I447 Left bundle-branch block, unspecified: Secondary | ICD-10-CM | POA: Insufficient documentation

## 2019-01-11 DIAGNOSIS — F419 Anxiety disorder, unspecified: Secondary | ICD-10-CM | POA: Insufficient documentation

## 2019-01-11 DIAGNOSIS — Z7901 Long term (current) use of anticoagulants: Secondary | ICD-10-CM | POA: Insufficient documentation

## 2019-01-11 DIAGNOSIS — I10 Essential (primary) hypertension: Secondary | ICD-10-CM | POA: Diagnosis not present

## 2019-01-11 DIAGNOSIS — E785 Hyperlipidemia, unspecified: Secondary | ICD-10-CM | POA: Insufficient documentation

## 2019-01-11 DIAGNOSIS — M1712 Unilateral primary osteoarthritis, left knee: Secondary | ICD-10-CM | POA: Insufficient documentation

## 2019-01-11 DIAGNOSIS — Z01812 Encounter for preprocedural laboratory examination: Secondary | ICD-10-CM | POA: Diagnosis not present

## 2019-01-11 DIAGNOSIS — I252 Old myocardial infarction: Secondary | ICD-10-CM | POA: Insufficient documentation

## 2019-01-11 HISTORY — DX: Polyneuropathy, unspecified: G62.9

## 2019-01-11 HISTORY — DX: Occlusion and stenosis of bilateral carotid arteries: I65.23

## 2019-01-11 HISTORY — DX: ST elevation (STEMI) myocardial infarction of unspecified site: I21.3

## 2019-01-11 HISTORY — DX: Anxiety disorder, unspecified: F41.9

## 2019-01-11 HISTORY — DX: Hyperlipidemia, unspecified: E78.5

## 2019-01-11 HISTORY — DX: Atherosclerotic heart disease of native coronary artery without angina pectoris: I25.10

## 2019-01-11 HISTORY — DX: Unspecified osteoarthritis, unspecified site: M19.90

## 2019-01-11 HISTORY — DX: Depression, unspecified: F32.A

## 2019-01-11 HISTORY — DX: Migraine, unspecified, not intractable, without status migrainosus: G43.909

## 2019-01-11 HISTORY — DX: Stress incontinence (female) (male): N39.3

## 2019-01-11 HISTORY — DX: Uterovaginal prolapse, unspecified: N81.4

## 2019-01-11 LAB — CBC
HCT: 41.3 % (ref 36.0–46.0)
Hemoglobin: 12.9 g/dL (ref 12.0–15.0)
MCH: 30.6 pg (ref 26.0–34.0)
MCHC: 31.2 g/dL (ref 30.0–36.0)
MCV: 97.9 fL (ref 80.0–100.0)
Platelets: 207 10*3/uL (ref 150–400)
RBC: 4.22 MIL/uL (ref 3.87–5.11)
RDW: 14 % (ref 11.5–15.5)
WBC: 6 10*3/uL (ref 4.0–10.5)
nRBC: 0 % (ref 0.0–0.2)

## 2019-01-11 LAB — COMPREHENSIVE METABOLIC PANEL
ALT: 18 U/L (ref 0–44)
AST: 16 U/L (ref 15–41)
Albumin: 3.9 g/dL (ref 3.5–5.0)
Alkaline Phosphatase: 31 U/L — ABNORMAL LOW (ref 38–126)
Anion gap: 7 (ref 5–15)
BUN: 31 mg/dL — ABNORMAL HIGH (ref 8–23)
CO2: 26 mmol/L (ref 22–32)
Calcium: 9 mg/dL (ref 8.9–10.3)
Chloride: 108 mmol/L (ref 98–111)
Creatinine, Ser: 1.37 mg/dL — ABNORMAL HIGH (ref 0.44–1.00)
GFR calc Af Amer: 44 mL/min — ABNORMAL LOW (ref >60)
GFR calc non Af Amer: 38 mL/min — ABNORMAL LOW (ref >60)
Glucose, Bld: 114 mg/dL — ABNORMAL HIGH (ref 70–99)
Potassium: 4.6 mmol/L (ref 3.5–5.1)
Sodium: 141 mmol/L (ref 135–145)
Total Bilirubin: 0.7 mg/dL (ref 0.3–1.2)
Total Protein: 6.8 g/dL (ref 6.5–8.1)

## 2019-01-11 LAB — ABO/RH: ABO/RH(D): O POS

## 2019-01-11 LAB — PROTIME-INR
INR: 1 (ref 0.8–1.2)
Prothrombin Time: 12.9 seconds (ref 11.4–15.2)

## 2019-01-11 LAB — HEMOGLOBIN A1C
Hgb A1c MFr Bld: 6.6 % — ABNORMAL HIGH (ref 4.8–5.6)
Mean Plasma Glucose: 142.72 mg/dL

## 2019-01-11 LAB — SURGICAL PCR SCREEN
MRSA, PCR: NEGATIVE
Staphylococcus aureus: NEGATIVE

## 2019-01-11 LAB — GLUCOSE, CAPILLARY: Glucose-Capillary: 144 mg/dL — ABNORMAL HIGH (ref 70–99)

## 2019-01-13 ENCOUNTER — Other Ambulatory Visit: Payer: Self-pay

## 2019-01-13 ENCOUNTER — Encounter
Admission: RE | Admit: 2019-01-13 | Discharge: 2019-01-13 | Disposition: A | Discharge status: Home / Self Care | Payer: BC Managed Care – PPO | Source: Ambulatory Visit | Attending: Cardiology | Admitting: Cardiology

## 2019-01-13 DIAGNOSIS — I25118 Atherosclerotic heart disease of native coronary artery with other forms of angina pectoris: Secondary | ICD-10-CM | POA: Diagnosis present

## 2019-01-13 MED ORDER — TECHNETIUM TC 99M TETROFOSMIN IV KIT
9.7380 | PACK | Freq: Once | INTRAVENOUS | Status: AC | PRN
  Administered 2019-01-13: 9.738 via INTRAVENOUS

## 2019-01-13 MED ORDER — TECHNETIUM TC 99M TETROFOSMIN IV KIT
30.0000 | PACK | Freq: Once | INTRAVENOUS | Status: AC | PRN
  Administered 2019-01-13: 33.5 via INTRAVENOUS

## 2019-01-13 MED ORDER — REGADENOSON 0.4 MG/5ML IV SOLN
0.4000 mg | Freq: Once | INTRAVENOUS | Status: AC
  Administered 2019-01-13: 0.4 mg via INTRAVENOUS

## 2019-01-13 NOTE — Progress Notes (Addendum)
Anesthesia Chart Review   Case: [id number] Date/Time: 01/18/19 1323   Procedure: Left knee medial unicompartmental arthroplasty (Left Knee) - 25min   Anesthesia type: Choice   Pre-op diagnosis: medial compartment osteoarthritis left knee   Location: [facility] ROOM 09 / [facility]   Surgeon: Gaynelle Arabian, MD      DISCUSSION:72 y.o. former smoker (quit 01/22/12) with h/o HTN, CAD, STEMI s/p PCI 2013, LBBB, carotid stenosis, HLD, medial compartment OA left knee scheduled for above procedure 01/18/2019 with Dr. Gaynelle Arabian.   Pt seen by cardiologist, Dr. Bartholome Bill, 01/07/2019 for preoperative evaluation.  Stress test ordered at this visit for cardiac risk stratification.  Stress test 01/13/2019, results pending.   Addendum:  Stress test 01/13/2019  There was no ST segment deviation noted during stress.  No T wave inversion was noted during stress.  This is a low risk study.  The left ventricular ejection fraction is mildly decreased (45-54%).   Fixed inferolateral defect. Low risk study No evidence of reversible ischemia VS: BP (!) 142/77 (BP Location: Left Arm)   Pulse 70   Temp 36.9 C (Oral)   Resp 18   Ht 5\' 2"  (1.575 m)   Wt 75.8 kg   SpO2 96%   BMI 30.58 kg/m   PROVIDERS: Derinda Late, MD is PCP   Bartholome Bill, MD is Cardiologist  LABS: Labs reviewed: Acceptable for surgery. (all labs ordered are listed, but only abnormal results are displayed)  Labs Reviewed  GLUCOSE, CAPILLARY - Abnormal; Notable for the following components:      Result Value   Glucose-Capillary 144 (*)    All other components within normal limits  COMPREHENSIVE METABOLIC PANEL - Abnormal; Notable for the following components:   Glucose, Bld 114 (*)    BUN 31 (*)    Creatinine, Ser 1.37 (*)    Alkaline Phosphatase 31 (*)    GFR calc non Af Amer 38 (*)    GFR calc Af Amer 44 (*)    All other components within normal limits  HEMOGLOBIN A1C - Abnormal; Notable for the following components:    Hgb A1c MFr Bld 6.6 (*)    All other components within normal limits  SURGICAL PCR SCREEN  CBC  PROTIME-INR  TYPE AND SCREEN  ABO/RH     IMAGES:   EKG: On chart   CV: Echo 01/08/2018 INTERPRETATION MILD LV SYSTOLIC DYSFUNCTION (See above)  WITH MILD LVH NORMAL RIGHT VENTRICULAR SYSTOLIC FUNCTION MILD VALVULAR REGURGITATION (See above) NO VALVULAR STENOSIS MILD MR, TR, PR EF 45% LVH: MILD LVH Closest EF: 45% (Estimated) Contraction: REGIONALLY IMPAIRED Mitral: MILD MR Tricuspid: MILD TR Past Medical History:  Diagnosis Date  . Anxiety   . Arthritis   . Carotid stenosis, bilateral   . Coronary artery disease   . Depression   . Hyperlipidemia   . Hypertension   . LBBB (left bundle branch block) 12/28/2018   Noted on EKG  . Migraines   . Neuropathy   . STEMI (ST elevation myocardial infarction) ([place])   . SUI (stress urinary incontinence, female)   . Uterine prolapse   . Varicose veins     Past Surgical History:  Procedure Laterality Date  . BREAST BIOPSY Left 03/20/2016   FRAGMENT OF CYST WALL.  Marland Kitchen BREAST BIOPSY Right 05/29/2017   ribbon clip, benign  . CAROTID STENT    . CHOLECYSTECTOMY    . COLONOSCOPY    . CORONARY ANGIOPLASTY    . UMBILICAL HERNIA REPAIR  MEDICATIONS: . ALPHA LIPOIC ACID PO  . ALPRAZolam (XANAX) 0.5 MG tablet  . aspirin EC 81 MG tablet  . Cholecalciferol (VITAMIN D-1000 MAX ST) 1000 units tablet  . citalopram (CELEXA) 20 MG tablet  . conjugated estrogens (PREMARIN) vaginal cream  . CRANBERRY PO  . diclofenac (VOLTAREN) 75 MG EC tablet  . ELDERBERRY PO  . fluticasone (FLONASE) 50 MCG/ACT nasal spray  . loratadine (CLARITIN) 10 MG tablet  . losartan-hydrochlorothiazide (HYZAAR) 50-12.5 MG tablet  . Magnesium Oxide 250 MG TABS  . Melatonin 3 MG TABS  . metoprolol succinate (TOPROL-XL) 50 MG 24 hr tablet  . montelukast (SINGULAIR) 10 MG tablet  . Omega-3 Fatty Acids (OMEGA-3 PO)  . polycarbophil (FIBERCON) 625 MG  tablet  . Probiotic Product (PROBIOTIC DAILY PO)  . rosuvastatin (CRESTOR) 5 MG tablet  . trimethoprim (TRIMPEX) 100 MG tablet  . Turmeric Curcumin 500 MG CAPS  . vitamin B-12 (CYANOCOBALAMIN) 100 MCG tablet   No current facility-administered medications for this encounter.     Konrad Felix, PA-C [facility] Pre-Surgical Testing [phone]

## 2019-01-14 ENCOUNTER — Other Ambulatory Visit
Admission: RE | Admit: 2019-01-14 | Discharge: 2019-01-14 | Disposition: A | Discharge status: Home / Self Care | Payer: BC Managed Care – PPO | Source: Ambulatory Visit | Attending: Orthopedic Surgery | Admitting: Orthopedic Surgery

## 2019-01-14 DIAGNOSIS — Z20828 Contact with and (suspected) exposure to other viral communicable diseases: Secondary | ICD-10-CM | POA: Insufficient documentation

## 2019-01-14 DIAGNOSIS — Z01812 Encounter for preprocedural laboratory examination: Secondary | ICD-10-CM | POA: Diagnosis not present

## 2019-01-15 LAB — NM MYOCAR MULTI W/SPECT W/WALL MOTION / EF
LV dias vol: 121 mL (ref 46–106)
LV sys vol: 63 mL
Peak HR: 87 {beats}/min
Rest HR: 61 {beats}/min
SDS: 3
SRS: 19
SSS: 18
TID: 0.95

## 2019-01-15 LAB — SARS CORONAVIRUS 2 (TAT 6-24 HRS): SARS Coronavirus 2: NEGATIVE

## 2019-01-15 NOTE — Anesthesia Preprocedure Evaluation (Addendum)
Anesthesia Evaluation  Patient identified by MRN, date of birth, ID band Patient awake    Reviewed: Allergy & Precautions, NPO status , Patient's Chart, lab work & pertinent test results, reviewed documented beta blocker date and time   Airway Mallampati: II  TM Distance: >3 FB Neck ROM: Full    Dental no notable dental hx.    Pulmonary former smoker,    Pulmonary exam normal breath sounds clear to auscultation       Cardiovascular hypertension, Pt. on medications and Pt. on home beta blockers + CAD, + Past MI and + Cardiac Stents  Normal cardiovascular exam Rhythm:Regular Rate:Normal  There was no ST segment deviation noted during stress. No T wave inversion was noted during stress. This is a low risk study. The left ventricular ejection fraction is mildly decreased (45-54%). Fixed inferolateral defect. Low risk study No evidence of reversible ischemia   Neuro/Psych  Headaches, PSYCHIATRIC DISORDERS Anxiety Depression    GI/Hepatic negative GI ROS, Neg liver ROS,   Endo/Other  negative endocrine ROS  Renal/GU negative Renal ROS     Musculoskeletal  (+) Arthritis , Osteoarthritis,    Abdominal (+) + obese,   Peds  Hematology HLD   Anesthesia Other Findings medial compartment osteoarthritis left knee  Reproductive/Obstetrics                           Anesthesia Physical Anesthesia Plan  ASA: III  Anesthesia Plan: Spinal and Regional   Post-op Pain Management:  Regional for Post-op pain   Induction: Intravenous  PONV Risk Score and Plan: 2 and Ondansetron, Dexamethasone, Treatment may vary due to age or medical condition and Midazolam  Airway Management Planned: Simple Face Mask  Additional Equipment:   Intra-op Plan:   Post-operative Plan:   Informed Consent: I have reviewed the patients History and Physical, chart, labs and discussed the procedure including the risks,  benefits and alternatives for the proposed anesthesia with the patient or authorized representative who has indicated his/her understanding and acceptance.     Dental advisory given  Plan Discussed with: CRNA  Anesthesia Plan Comments: (Reviewed PAT note 01/11/2019, Konrad Felix, PA-C)      Anesthesia Quick Evaluation

## 2019-01-15 NOTE — H&P (Signed)
PARTIAL KNEE ADMISSION H&P  Patient is being admitted for left knee medial unicompartmental arthroplasty.  Subjective:  Chief Complaint:left knee pain.  HPI: Chelsea BRIDENSTINE, 73 y.o. female, has a history of pain and functional disability in the left knee due to arthritis and has failed non-surgical conservative treatments for greater than 12 weeks to includeNSAID's and/or analgesics, corticosteriod injections and activity modification.  Onset of symptoms was gradual, starting 2 years ago with gradually worsening course since that time. The patient noted no past surgery on the left knee(s).  Patient currently rates pain in the left knee(s) at 4 out of 10 with activity. Patient has worsening of pain with activity and weight bearing and pain that interferes with activities of daily living.  Patient has evidence of medial narrowing that is close to bone-on-bone in the left knee. She also has what appears to be a large osteochondral defect on the medial femoral condyle by imaging studies. There is no active infection.  Patient Active Problem List   Diagnosis Date Noted  . Pain in limb 01/23/2016  . Swelling of limb 01/23/2016  . Carotid disease, bilateral ([facility]) 01/23/2016  . Essential hypertension 01/23/2016   Past Medical History:  Diagnosis Date  . Anxiety   . Arthritis   . Carotid stenosis, bilateral   . Coronary artery disease   . Depression   . Hyperlipidemia   . Hypertension   . LBBB (left bundle branch block) 12/28/2018   Noted on EKG  . Migraines   . Neuropathy   . STEMI (ST elevation myocardial infarction) ([facility])   . SUI (stress urinary incontinence, female)   . Uterine prolapse   . Varicose veins     Past Surgical History:  Procedure Laterality Date  . BREAST BIOPSY Left 03/20/2016   FRAGMENT OF CYST WALL.  Marland Kitchen BREAST BIOPSY Right 05/29/2017   ribbon clip, benign  . CAROTID STENT    . CHOLECYSTECTOMY    . COLONOSCOPY    . CORONARY ANGIOPLASTY    . UMBILICAL HERNIA REPAIR       No current facility-administered medications for this encounter.    Current Outpatient Medications  Medication Sig Dispense Refill Last Dose  . ALPHA LIPOIC ACID PO Take 600 mg by mouth daily.     Marland Kitchen ALPRAZolam (XANAX) 0.5 MG tablet Take 0.25 mg by mouth at bedtime.      Marland Kitchen aspirin EC 81 MG tablet Take 81 mg by mouth at bedtime.      . Cholecalciferol (VITAMIN D-1000 MAX ST) 1000 units tablet Take 5,000 Units by mouth daily.      . citalopram (CELEXA) 20 MG tablet Take 20 mg by mouth daily.   3   . conjugated estrogens (PREMARIN) vaginal cream Place 1 Applicatorful vaginally See admin instructions. Every 14 days     . CRANBERRY PO Take 16,800 tablets by mouth daily. Tablets 4800 mg  X 4     . diclofenac (VOLTAREN) 75 MG EC tablet Take 75 mg by mouth daily.     Marland Kitchen ELDERBERRY PO Take 3,500 mg by mouth 2 (two) times daily. Tablets 1250 x 2     . fluticasone (FLONASE) 50 MCG/ACT nasal spray Place 2 sprays into both nostrils daily.      Marland Kitchen loratadine (CLARITIN) 10 MG tablet Take 10 mg by mouth daily as needed for allergies.      Marland Kitchen losartan-hydrochlorothiazide (HYZAAR) 50-12.5 MG tablet Take 1 tablet by mouth daily.      . Magnesium Oxide  250 MG TABS Take 250 mg by mouth daily.     . Melatonin 3 MG TABS Take 3 mg by mouth at bedtime.      . metoprolol succinate (TOPROL-XL) 50 MG 24 hr tablet Take 50 mg by mouth daily.      . montelukast (SINGULAIR) 10 MG tablet Take 10 mg by mouth at bedtime.     . Omega-3 Fatty Acids (OMEGA-3 PO) Take 720 mg by mouth daily. 720 omega 3 2400 fish oil     . polycarbophil (FIBERCON) 625 MG tablet Take 625 mg by mouth daily.     . Probiotic Product (PROBIOTIC DAILY PO) Take 1 tablet by mouth daily. 20 billion      . rosuvastatin (CRESTOR) 5 MG tablet Take 5 mg by mouth daily.     Marland Kitchen trimethoprim (TRIMPEX) 100 MG tablet Take 100 mg by mouth daily.     . Turmeric Curcumin 500 MG CAPS Take 500 mg by mouth 2 (two) times daily.     . vitamin B-12 (CYANOCOBALAMIN) 100  MCG tablet Take 100 mcg by mouth daily.      Allergies  Allergen Reactions  . Ace Inhibitors Other (See Comments)    Mouth ulcers  . Lipitor [Atorvastatin] Diarrhea  . Lisinopril Itching  . Simvastatin Diarrhea  . Statins Diarrhea    Social History   Tobacco Use  . Smoking status: Former Smoker    Packs/day: 1.00    Types: Cigarettes    Quit date: 01/22/2012    Years since quitting: 6.9  . Smokeless tobacco: Never Used  . Tobacco comment: 50   Substance Use Topics  . Alcohol use: Yes    Comment: occas    Family History  Problem Relation Age of Onset  . Breast cancer Sister 15  . Cancer Father   . Hypertension Father   . Breast cancer Daughter 68     Review of Systems  Constitutional: Negative for chills and fever.  Respiratory: Negative for cough and shortness of breath.   Cardiovascular: Negative for chest pain and palpitations.  Gastrointestinal: Negative for nausea and vomiting.  Musculoskeletal: Positive for joint pain.    Objective:  Physical Exam Patient is a 73 year old female.  Well nourished and well developed. General: Alert and oriented x3, cooperative and pleasant, no acute distress. Head: normocephalic, atraumatic, neck supple. Eyes: EOMI. Respiratory: breath sounds clear in all fields, no wheezing, rales, or rhonchi. Cardiovascular: Regular rate and rhythm, no murmurs, gallops or rubs. Abdomen: non-tender to palpation and soft, normoactive bowel sounds.  Musculoskeletal: Right Knee Exam: No effusion. Range of motion is normal. No crepitus on range of motion of the knee. No medial joint line tenderness. No lateral joint line tenderness. Stable knee.  Left Knee Exam: Trace effusion and a very large Baker's cyst present. Range of motion is 0-125 degrees. Minimal crepitus on range of motion of the knee. Severe medial joint line tenderness. No lateral joint line tenderness. Stable knee. Calves soft and nontender. Motor function intact in  LE. Strength 5/5 LE bilaterally. Neuro: Distal pulses 2+. Sensation to light touch intact in LE.  Vital signs in last 24 hours:    Labs:   Estimated body mass index is 30.58 kg/m as calculated from the following:   Height as of 01/11/19: 5\' 2"  (1.575 m).   Weight as of 01/11/19: 75.8 kg.   Imaging Review Plain radiographs demonstrate severe degenerative joint disease of the medial compartment of the left knee(s). The overall alignment isneutral.  The bone quality appears to be adequate for age and reported activity level.  Assessment/Plan:  End stage arthritis, left knee   The patient history, physical examination, clinical judgment of the provider and imaging studies are consistent with end stage degenerative joint disease of the left knee(s) and partial knee arthroplasty is deemed medically necessary. The treatment options including medical management, injection therapy arthroscopy and arthroplasty were discussed at length. The risks and benefits of total knee arthroplasty were presented and reviewed. The risks due to aseptic loosening, infection, stiffness, patella tracking problems, thromboembolic complications and other imponderables were discussed. The patient acknowledged the explanation, agreed to proceed with the plan and consent was signed. Patient is being admitted for inpatient treatment for surgery, pain control, PT, OT, prophylactic antibiotics, VTE prophylaxis, progressive ambulation and ADL's and discharge planning. The patient is planning to be discharged home.  Therapy Plans: outpatient therapy at [facility] in [place] Disposition: Home with husband Planned DVT Prophylaxis: Xarelto 10mg  daily (hx of MI/stents) DME needed: walker PCP: Dr. Baldemar Lenis, clearance received Cardiologist: Dr. Ubaldo Glassing TXA: IV Allergies: lisinopril - itching, statins (at high dose) - diarrhea Anesthesia Concerns: none BMI: 30.7 Last HgbA1c: 6.7%  Other: Hx of MI in 2013, with 1 cardiac  stent. Trimethoprim - maintenance due to regular UTIs.  Instructed patient on which medications to discontinue 5 days prior to surgery. Will follow-up in office with Dr. Wynelle Link 2 weeks post-op.   Patient's anticipated LOS is less than 2 midnights, meeting these requirements: - Younger than 57 - Lives within 1 hour of care - Has a competent adult at home to recover with post-op recover - NO history of  - Chronic pain requiring opiods  - Diabetes  - Coronary Artery Disease  - Heart failure  - Heart attack  - Stroke  - DVT/VTE  - Cardiac arrhythmia  - Respiratory Failure/COPD  - Renal failure  - Anemia  - Advanced Liver disease  - Patient was instructed on what medications to stop prior to surgery. - Follow-up visit in 2 weeks with Dr. Wynelle Link - Begin physical therapy following surgery - Pre-operative lab work as pre-surgical testing - Prescriptions will be provided in hospital at time of discharge  Griffith Citron, PA-C Orthopedic Surgery [facility] [place] [phone]

## 2019-01-17 MED ORDER — BUPIVACAINE LIPOSOME 1.3 % IJ SUSP
20.0000 mL | Freq: Once | INTRAMUSCULAR | Status: DC
  Filled 2019-01-17: qty 20

## 2019-01-18 ENCOUNTER — Encounter (HOSPITAL_COMMUNITY): Payer: Self-pay

## 2019-01-18 ENCOUNTER — Ambulatory Visit (HOSPITAL_COMMUNITY): Payer: BC Managed Care – PPO | Admitting: Certified Registered Nurse Anesthetist

## 2019-01-18 ENCOUNTER — Encounter (HOSPITAL_COMMUNITY)
Admission: RE | Disposition: A | Discharge status: Home / Self Care | Payer: Self-pay | Source: Home / Self Care | Attending: Orthopedic Surgery

## 2019-01-18 ENCOUNTER — Ambulatory Visit (HOSPITAL_COMMUNITY): Payer: BC Managed Care – PPO | Admitting: Physician Assistant

## 2019-01-18 ENCOUNTER — Ambulatory Visit (HOSPITAL_COMMUNITY)
Admission: RE | Admit: 2019-01-18 | Discharge: 2019-01-19 | Disposition: A | Discharge status: Home / Self Care | Payer: BC Managed Care – PPO | Attending: Orthopedic Surgery | Admitting: Orthopedic Surgery

## 2019-01-18 ENCOUNTER — Other Ambulatory Visit: Payer: Self-pay

## 2019-01-18 DIAGNOSIS — E785 Hyperlipidemia, unspecified: Secondary | ICD-10-CM | POA: Insufficient documentation

## 2019-01-18 DIAGNOSIS — M179 Osteoarthritis of knee, unspecified: Secondary | ICD-10-CM | POA: Diagnosis present

## 2019-01-18 DIAGNOSIS — Z79899 Other long term (current) drug therapy: Secondary | ICD-10-CM | POA: Diagnosis not present

## 2019-01-18 DIAGNOSIS — Z955 Presence of coronary angioplasty implant and graft: Secondary | ICD-10-CM | POA: Insufficient documentation

## 2019-01-18 DIAGNOSIS — I252 Old myocardial infarction: Secondary | ICD-10-CM | POA: Insufficient documentation

## 2019-01-18 DIAGNOSIS — M1712 Unilateral primary osteoarthritis, left knee: Secondary | ICD-10-CM | POA: Insufficient documentation

## 2019-01-18 DIAGNOSIS — I10 Essential (primary) hypertension: Secondary | ICD-10-CM | POA: Diagnosis not present

## 2019-01-18 DIAGNOSIS — F419 Anxiety disorder, unspecified: Secondary | ICD-10-CM | POA: Diagnosis not present

## 2019-01-18 DIAGNOSIS — F329 Major depressive disorder, single episode, unspecified: Secondary | ICD-10-CM | POA: Insufficient documentation

## 2019-01-18 DIAGNOSIS — Z79890 Hormone replacement therapy (postmenopausal): Secondary | ICD-10-CM | POA: Diagnosis not present

## 2019-01-18 DIAGNOSIS — Z7982 Long term (current) use of aspirin: Secondary | ICD-10-CM | POA: Diagnosis not present

## 2019-01-18 DIAGNOSIS — Z87891 Personal history of nicotine dependence: Secondary | ICD-10-CM | POA: Diagnosis not present

## 2019-01-18 DIAGNOSIS — I2510 Atherosclerotic heart disease of native coronary artery without angina pectoris: Secondary | ICD-10-CM | POA: Insufficient documentation

## 2019-01-18 DIAGNOSIS — M1710 Unilateral primary osteoarthritis, unspecified knee: Secondary | ICD-10-CM | POA: Diagnosis present

## 2019-01-18 HISTORY — PX: PARTIAL KNEE ARTHROPLASTY: SHX2174

## 2019-01-18 LAB — TYPE AND SCREEN
ABO/RH(D): O POS
Antibody Screen: NEGATIVE

## 2019-01-18 SURGERY — ARTHROPLASTY, KNEE, UNICOMPARTMENTAL
Anesthesia: Regional | Site: Knee | Laterality: Left

## 2019-01-18 MED ORDER — ONDANSETRON HCL 4 MG/2ML IJ SOLN: 4.0000 mg | Freq: Four times a day (QID) | INTRAMUSCULAR | Status: DC | PRN

## 2019-01-18 MED ORDER — ACETAMINOPHEN 500 MG PO TABS
1000.0000 mg | ORAL_TABLET | Freq: Four times a day (QID) | ORAL | Status: DC
  Administered 2019-01-18 – 2019-01-19 (×3): 1000 mg via ORAL
  Filled 2019-01-18 (×4): qty 2

## 2019-01-18 MED ORDER — GABAPENTIN 100 MG PO CAPS
200.0000 mg | ORAL_CAPSULE | Freq: Three times a day (TID) | ORAL | Status: DC
  Administered 2019-01-18 – 2019-01-19 (×2): 200 mg via ORAL
  Filled 2019-01-18 (×2): qty 2

## 2019-01-18 MED ORDER — LIDOCAINE 2% (20 MG/ML) 5 ML SYRINGE
INTRAMUSCULAR | Status: DC | PRN
  Administered 2019-01-18: 40 mg via INTRAVENOUS

## 2019-01-18 MED ORDER — CITALOPRAM HYDROBROMIDE 20 MG PO TABS
20.0000 mg | ORAL_TABLET | Freq: Every day | ORAL | Status: DC
  Administered 2019-01-19: 20 mg via ORAL
  Filled 2019-01-18: qty 1

## 2019-01-18 MED ORDER — RIVAROXABAN 10 MG PO TABS
10.0000 mg | ORAL_TABLET | Freq: Every day | ORAL | Status: DC
  Administered 2019-01-19: 10 mg via ORAL
  Filled 2019-01-18: qty 1

## 2019-01-18 MED ORDER — SODIUM CHLORIDE 0.9 % IV SOLN
INTRAVENOUS | Status: DC
  Administered 2019-01-18: 17:00:00 via INTRAVENOUS

## 2019-01-18 MED ORDER — MONTELUKAST SODIUM 10 MG PO TABS
10.0000 mg | ORAL_TABLET | Freq: Every day | ORAL | Status: DC
  Administered 2019-01-18: 10 mg via ORAL
  Filled 2019-01-18: qty 1

## 2019-01-18 MED ORDER — SODIUM CHLORIDE 0.9 % IR SOLN
Status: DC | PRN
  Administered 2019-01-18: 1000 mL

## 2019-01-18 MED ORDER — BISACODYL 10 MG RE SUPP: 10.0000 mg | Freq: Every day | RECTAL | Status: DC | PRN

## 2019-01-18 MED ORDER — MIDAZOLAM HCL 2 MG/2ML IJ SOLN
1.0000 mg | INTRAMUSCULAR | Status: DC
  Administered 2019-01-18 (×2): 1 mg via INTRAVENOUS
  Filled 2019-01-18: qty 2

## 2019-01-18 MED ORDER — OXYCODONE HCL 5 MG PO TABS: 5.0000 mg | ORAL_TABLET | ORAL | Status: DC | PRN

## 2019-01-18 MED ORDER — BUPIVACAINE LIPOSOME 1.3 % IJ SUSP
INTRAMUSCULAR | Status: DC | PRN
  Administered 2019-01-18: 20 mL

## 2019-01-18 MED ORDER — ROPIVACAINE HCL 5 MG/ML IJ SOLN
INTRAMUSCULAR | Status: DC | PRN
  Administered 2019-01-18: 30 mL via PERINEURAL

## 2019-01-18 MED ORDER — CEFAZOLIN SODIUM-DEXTROSE 2-4 GM/100ML-% IV SOLN
2.0000 g | INTRAVENOUS | Status: AC
  Administered 2019-01-18: 13:00:00 2 g via INTRAVENOUS
  Filled 2019-01-18: qty 100

## 2019-01-18 MED ORDER — METHOCARBAMOL 500 MG IVPB - SIMPLE MED
500.0000 mg | Freq: Four times a day (QID) | INTRAVENOUS | Status: DC | PRN
  Filled 2019-01-18: qty 50

## 2019-01-18 MED ORDER — KETOROLAC TROMETHAMINE 15 MG/ML IJ SOLN: 15.0000 mg | Freq: Once | INTRAMUSCULAR | Status: DC | PRN

## 2019-01-18 MED ORDER — DOCUSATE SODIUM 100 MG PO CAPS
100.0000 mg | ORAL_CAPSULE | Freq: Two times a day (BID) | ORAL | Status: DC
  Administered 2019-01-18 – 2019-01-19 (×2): 100 mg via ORAL
  Filled 2019-01-18 (×2): qty 1

## 2019-01-18 MED ORDER — METOCLOPRAMIDE HCL 5 MG/ML IJ SOLN: 5.0000 mg | Freq: Three times a day (TID) | INTRAMUSCULAR | Status: DC | PRN

## 2019-01-18 MED ORDER — TRIMETHOPRIM 100 MG PO TABS
100.0000 mg | ORAL_TABLET | Freq: Every day | ORAL | Status: DC
  Administered 2019-01-19: 100 mg via ORAL
  Filled 2019-01-18: qty 1

## 2019-01-18 MED ORDER — MENTHOL 3 MG MT LOZG: 1.0000 | LOZENGE | OROMUCOSAL | Status: DC | PRN

## 2019-01-18 MED ORDER — OXYCODONE HCL 5 MG PO TABS
10.0000 mg | ORAL_TABLET | ORAL | Status: DC | PRN
  Administered 2019-01-18 – 2019-01-19 (×2): 10 mg via ORAL
  Filled 2019-01-18 (×2): qty 2

## 2019-01-18 MED ORDER — SODIUM CHLORIDE (PF) 0.9 % IJ SOLN
INTRAMUSCULAR | Status: DC | PRN
  Administered 2019-01-18: 60 mL

## 2019-01-18 MED ORDER — SODIUM CHLORIDE (PF) 0.9 % IJ SOLN
INTRAMUSCULAR | Status: AC
  Filled 2019-01-18: qty 10

## 2019-01-18 MED ORDER — FENTANYL CITRATE (PF) 100 MCG/2ML IJ SOLN
50.0000 ug | INTRAMUSCULAR | Status: DC
  Filled 2019-01-18: qty 2

## 2019-01-18 MED ORDER — SODIUM CHLORIDE (PF) 0.9 % IJ SOLN
INTRAMUSCULAR | Status: AC
  Filled 2019-01-18: qty 50

## 2019-01-18 MED ORDER — LORATADINE 10 MG PO TABS: 10.0000 mg | ORAL_TABLET | Freq: Every day | ORAL | Status: DC | PRN

## 2019-01-18 MED ORDER — CEFAZOLIN SODIUM-DEXTROSE 2-4 GM/100ML-% IV SOLN
2.0000 g | Freq: Four times a day (QID) | INTRAVENOUS | Status: AC
  Administered 2019-01-18 – 2019-01-19 (×2): 2 g via INTRAVENOUS
  Filled 2019-01-18 (×2): qty 100

## 2019-01-18 MED ORDER — MORPHINE SULFATE (PF) 4 MG/ML IV SOLN: 1.0000 mg | INTRAVENOUS | Status: DC | PRN

## 2019-01-18 MED ORDER — HYDROCHLOROTHIAZIDE 12.5 MG PO CAPS
12.5000 mg | ORAL_CAPSULE | Freq: Every day | ORAL | Status: DC
  Administered 2019-01-19: 12.5 mg via ORAL
  Filled 2019-01-18: qty 1

## 2019-01-18 MED ORDER — ONDANSETRON HCL 4 MG/2ML IJ SOLN: 4.0000 mg | Freq: Once | INTRAMUSCULAR | Status: DC | PRN

## 2019-01-18 MED ORDER — CHLORHEXIDINE GLUCONATE 4 % EX LIQD: 60.0000 mL | Freq: Once | CUTANEOUS | Status: DC

## 2019-01-18 MED ORDER — METOCLOPRAMIDE HCL 5 MG PO TABS: 5.0000 mg | ORAL_TABLET | Freq: Three times a day (TID) | ORAL | Status: DC | PRN

## 2019-01-18 MED ORDER — ACETAMINOPHEN 10 MG/ML IV SOLN
1000.0000 mg | Freq: Four times a day (QID) | INTRAVENOUS | Status: DC
  Administered 2019-01-18: 1000 mg via INTRAVENOUS
  Filled 2019-01-18: qty 100

## 2019-01-18 MED ORDER — BUPIVACAINE HCL (PF) 0.75 % IJ SOLN
INTRAMUSCULAR | Status: DC | PRN
  Administered 2019-01-18: 1.6 mL via INTRATHECAL

## 2019-01-18 MED ORDER — LOSARTAN POTASSIUM 50 MG PO TABS
50.0000 mg | ORAL_TABLET | Freq: Every day | ORAL | Status: DC
  Administered 2019-01-19: 50 mg via ORAL
  Filled 2019-01-18: qty 1

## 2019-01-18 MED ORDER — PROPOFOL 10 MG/ML IV BOLUS
INTRAVENOUS | Status: DC | PRN
  Administered 2019-01-18 (×2): 20 mg via INTRAVENOUS

## 2019-01-18 MED ORDER — FLUTICASONE PROPIONATE 50 MCG/ACT NA SUSP
2.0000 | Freq: Every day | NASAL | Status: DC
  Filled 2019-01-18: qty 16

## 2019-01-18 MED ORDER — LOSARTAN POTASSIUM-HCTZ 50-12.5 MG PO TABS: 1.0000 | ORAL_TABLET | Freq: Every day | ORAL | Status: DC

## 2019-01-18 MED ORDER — DEXAMETHASONE SODIUM PHOSPHATE 10 MG/ML IJ SOLN
10.0000 mg | Freq: Once | INTRAMUSCULAR | Status: AC
  Administered 2019-01-19: 10 mg via INTRAVENOUS
  Filled 2019-01-18: qty 1

## 2019-01-18 MED ORDER — FLEET ENEMA 7-19 GM/118ML RE ENEM: 1.0000 | ENEMA | Freq: Once | RECTAL | Status: DC | PRN

## 2019-01-18 MED ORDER — TRANEXAMIC ACID-NACL 1000-0.7 MG/100ML-% IV SOLN
1000.0000 mg | INTRAVENOUS | Status: AC
  Administered 2019-01-18: 1000 mg via INTRAVENOUS
  Filled 2019-01-18: qty 100

## 2019-01-18 MED ORDER — ONDANSETRON HCL 4 MG/2ML IJ SOLN
INTRAMUSCULAR | Status: DC | PRN
  Administered 2019-01-18: 4 mg via INTRAVENOUS

## 2019-01-18 MED ORDER — LACTATED RINGERS IV SOLN
INTRAVENOUS | Status: DC
  Administered 2019-01-18: 12:00:00 via INTRAVENOUS

## 2019-01-18 MED ORDER — PHENOL 1.4 % MT LIQD: 1.0000 | OROMUCOSAL | Status: DC | PRN

## 2019-01-18 MED ORDER — ALPRAZOLAM 0.25 MG PO TABS
0.2500 mg | ORAL_TABLET | Freq: Every day | ORAL | Status: DC
  Administered 2019-01-18: 0.25 mg via ORAL
  Filled 2019-01-18: qty 1

## 2019-01-18 MED ORDER — PROPOFOL 500 MG/50ML IV EMUL
INTRAVENOUS | Status: AC
  Filled 2019-01-18: qty 50

## 2019-01-18 MED ORDER — ROSUVASTATIN CALCIUM 5 MG PO TABS
5.0000 mg | ORAL_TABLET | Freq: Every day | ORAL | Status: DC
  Administered 2019-01-19: 5 mg via ORAL
  Filled 2019-01-18: qty 1

## 2019-01-18 MED ORDER — ONDANSETRON HCL 4 MG PO TABS: 4.0000 mg | ORAL_TABLET | Freq: Four times a day (QID) | ORAL | Status: DC | PRN

## 2019-01-18 MED ORDER — DEXAMETHASONE SODIUM PHOSPHATE 10 MG/ML IJ SOLN
8.0000 mg | Freq: Once | INTRAMUSCULAR | Status: AC
  Administered 2019-01-18: 8 mg via INTRAVENOUS

## 2019-01-18 MED ORDER — POLYETHYLENE GLYCOL 3350 17 G PO PACK: 17.0000 g | PACK | Freq: Every day | ORAL | Status: DC | PRN

## 2019-01-18 MED ORDER — FENTANYL CITRATE (PF) 100 MCG/2ML IJ SOLN: 25.0000 ug | INTRAMUSCULAR | Status: DC | PRN

## 2019-01-18 MED ORDER — PROPOFOL 500 MG/50ML IV EMUL
INTRAVENOUS | Status: DC | PRN
  Administered 2019-01-18: 100 ug/kg/min via INTRAVENOUS

## 2019-01-18 MED ORDER — DIPHENHYDRAMINE HCL 12.5 MG/5ML PO ELIX: 12.5000 mg | ORAL_SOLUTION | ORAL | Status: DC | PRN

## 2019-01-18 MED ORDER — METHOCARBAMOL 500 MG PO TABS
500.0000 mg | ORAL_TABLET | Freq: Four times a day (QID) | ORAL | Status: DC | PRN
  Administered 2019-01-18 – 2019-01-19 (×3): 500 mg via ORAL
  Filled 2019-01-18 (×3): qty 1

## 2019-01-18 MED ORDER — METOPROLOL SUCCINATE ER 50 MG PO TB24
50.0000 mg | ORAL_TABLET | Freq: Every day | ORAL | Status: DC
  Administered 2019-01-19: 50 mg via ORAL
  Filled 2019-01-18: qty 1

## 2019-01-18 MED ORDER — POVIDONE-IODINE 10 % EX SWAB
2.0000 "application " | Freq: Once | CUTANEOUS | Status: AC
  Administered 2019-01-18: 2 via TOPICAL

## 2019-01-18 SURGICAL SUPPLY — 54 items
BAG DECANTER FOR FLEXI CONT (MISCELLANEOUS) IMPLANT
BAG ZIPLOCK 12X15 (MISCELLANEOUS) IMPLANT
BLADE SAW RECIPROCATING 77.5 (BLADE) ×3 IMPLANT
BLADE SAW SGTL 11.0X1.19X90.0M (BLADE) ×3 IMPLANT
BNDG ELASTIC 6X5.8 VLCR STR LF (GAUZE/BANDAGES/DRESSINGS) ×3 IMPLANT
BOWL SMART MIX CTS (DISPOSABLE) ×3 IMPLANT
CEMENT HV SMART SET (Cement) ×3 IMPLANT
CLOSURE WOUND 1/2 X4 (GAUZE/BANDAGES/DRESSINGS) ×1
COMPONENT TIB PERSONA SZ C LT (Knees) ×1 IMPLANT
COVER SURGICAL LIGHT HANDLE (MISCELLANEOUS) ×3 IMPLANT
COVER WAND RF STERILE (DRAPES) IMPLANT
CUFF TOURN SGL QUICK 34 (TOURNIQUET CUFF) ×2
CUFF TRNQT CYL 34X4.125X (TOURNIQUET CUFF) ×1 IMPLANT
DECANTER SPIKE VIAL GLASS SM (MISCELLANEOUS) ×6 IMPLANT
DRAPE U-SHAPE 47X51 STRL (DRAPES) ×3 IMPLANT
DRSG ADAPTIC 3X8 NADH LF (GAUZE/BANDAGES/DRESSINGS) ×3 IMPLANT
DRSG PAD ABDOMINAL 8X10 ST (GAUZE/BANDAGES/DRESSINGS) ×3 IMPLANT
DURAPREP 26ML APPLICATOR (WOUND CARE) ×3 IMPLANT
ELECT REM PT RETURN 15FT ADLT (MISCELLANEOUS) ×3 IMPLANT
GAUZE SPONGE 4X4 12PLY STRL (GAUZE/BANDAGES/DRESSINGS) ×3 IMPLANT
GLOVE BIO SURGEON STRL SZ 6.5 (GLOVE) ×2 IMPLANT
GLOVE BIO SURGEON STRL SZ8 (GLOVE) ×3 IMPLANT
GLOVE BIO SURGEONS STRL SZ 6.5 (GLOVE) ×1
GLOVE BIOGEL PI IND STRL 6.5 (GLOVE) ×1 IMPLANT
GLOVE BIOGEL PI IND STRL 8 (GLOVE) ×2 IMPLANT
GLOVE BIOGEL PI INDICATOR 6.5 (GLOVE) ×2
GLOVE BIOGEL PI INDICATOR 8 (GLOVE) ×4
GOWN STRL REUS W/TWL LRG LVL3 (GOWN DISPOSABLE) ×9 IMPLANT
HANDPIECE INTERPULSE COAX TIP (DISPOSABLE) ×2
HDLS TROCR DRIL PIN KNEE 75 (Miscellaneous) ×2 IMPLANT
IMMOBILIZER KNEE 20 (SOFTGOODS) ×3
IMMOBILIZER KNEE 20 THIGH 36 (SOFTGOODS) ×1 IMPLANT
INSERT ARTSRF LT MDL SZ C 9 (Miscellaneous) ×3 IMPLANT
INSERTER TIP PARTIAL KNEE (MISCELLANEOUS) ×3 IMPLANT
KIT TURNOVER KIT A (KITS) IMPLANT
MANIFOLD NEPTUNE II (INSTRUMENTS) ×3 IMPLANT
PACK TOTAL KNEE CUSTOM (KITS) ×3 IMPLANT
PADDING CAST COTTON 6X4 STRL (CAST SUPPLIES) ×3 IMPLANT
PERSONA PART KNEE SYS SZ C LT (Knees) ×3 IMPLANT
PIN DRILL HDLS TROCAR 75 4PK (Miscellaneous) ×1 IMPLANT
PROTECTOR NERVE ULNAR (MISCELLANEOUS) ×3 IMPLANT
SCREW HEADED 33MM KNEE (MISCELLANEOUS) ×6 IMPLANT
SCREW HEADED 48MM KNEE (MISCELLANEOUS) ×3 IMPLANT
SET HNDPC FAN SPRY TIP SCT (DISPOSABLE) ×1 IMPLANT
STRIP CLOSURE SKIN 1/2X4 (GAUZE/BANDAGES/DRESSINGS) ×2 IMPLANT
SUT MNCRL AB 4-0 PS2 18 (SUTURE) ×3 IMPLANT
SUT STRATAFIX 0 PDS 27 VIOLET (SUTURE) ×3
SUT VIC AB 2-0 CT1 27 (SUTURE) ×4
SUT VIC AB 2-0 CT1 TAPERPNT 27 (SUTURE) ×2 IMPLANT
SUTURE STRATFX 0 PDS 27 VIOLET (SUTURE) ×1 IMPLANT
SYSTEM KNEE FEMUR SZ 3 LT (Orthopedic Implant) ×3 IMPLANT
WATER STERILE IRR 1000ML POUR (IV SOLUTION) ×6 IMPLANT
WRAP KNEE MAXI GEL POST OP (GAUZE/BANDAGES/DRESSINGS) ×3 IMPLANT
YANKAUER SUCT BULB TIP 10FT TU (MISCELLANEOUS) ×3 IMPLANT

## 2019-01-18 NOTE — Transfer of Care (Signed)
Immediate Anesthesia Transfer of Care Note  Patient: Chelsea Fitzgerald  Procedure(s) Performed: Left knee medial unicompartmental arthroplasty (Left Knee)  Patient Location: PACU  Anesthesia Type:MAC and Spinal  Level of Consciousness: awake, alert  and oriented  Airway & Oxygen Therapy: Patient Spontanous Breathing and Patient connected to nasal cannula oxygen  Post-op Assessment: Report given to RN and Post -op Vital signs reviewed and stable  Post vital signs: Reviewed and stable  Last Vitals:  Vitals Value Taken Time  BP 112/62 01/18/19 1456  Temp    Pulse 57 01/18/19 1458  Resp 18 01/18/19 1458  SpO2 95 % 01/18/19 1458  Vitals shown include unvalidated device data.  Last Pain:  Vitals:   01/18/19 1302  TempSrc:   PainSc: 0-No pain         Complications: No apparent anesthesia complications

## 2019-01-18 NOTE — Discharge Instructions (Signed)
Dr. Gaynelle Arabian Total Joint Specialist [facility] [address], [place], [place] [address] [phone]  UNI KNEE REPLACEMENT POSTOPERATIVE DIRECTIONS  Knee Rehabilitation, Guidelines Following Surgery  Results after knee surgery are often greatly improved when you follow the exercise, range of motion and muscle strengthening exercises prescribed by your doctor. Safety measures are also important to protect the knee from further injury. Any time any of these exercises cause you to have increased pain or swelling in your knee joint, decrease the amount until you are comfortable again and slowly increase them. If you have problems or questions, call your caregiver or physical therapist for advice.    HOME CARE INSTRUCTIONS   Remove items at home which could result in a fall. This includes throw rugs or furniture in walking pathways.   ICE to the affected knee every three hours for 30 minutes at a time and then as needed for pain and swelling.  Continue to use ice on the knee for pain and swelling from surgery. You may notice swelling that will progress down to the foot and ankle.  This is normal after surgery.  Elevate the leg when you are not up walking on it.    Continue to use the breathing machine which will help keep your temperature down.  It is common for your temperature to cycle up and down following surgery, especially at night when you are not up moving around and exerting yourself.  The breathing machine keeps your lungs expanded and your temperature down.  Do not place pillow under knee, focus on keeping the knee straight while resting  DIET You may resume your previous home diet once your are discharged from the hospital.  DRESSING / WOUND CARE / SHOWERING You may change your dressing 3-5 days after surgery.  Then change the dressing every day with sterile gauze.  Please use good hand washing techniques before changing the dressing.  Do not use any lotions  or creams on the incision until instructed by your surgeon. Leave the surgical dressing on the knee for 48 hours.  May remove the dressing on the second day.  Remove the Ace Wrap along with the cotton padding.  Leave the steri-strip bandaids in place along the incision on the skin.  Cover the incision each day with some dry gauze and paper tape. You may start showering once you are discharged home but do not submerge the incision under water. Just pat the incision dry and apply a dry gauze dressing on daily. Change the surgical dressing daily and reapply a dry dressing each time.  ACTIVITY Walk with your walker as instructed. Use walker as long as suggested by your caregivers. Avoid periods of inactivity such as sitting longer than an hour when not asleep. This helps prevent blood clots.  You may resume a sexual relationship in one month or when given the OK by your doctor.  You may return to work once you are cleared by your doctor.  Do not drive a car for 6 weeks or until released by you surgeon.  Do not drive while taking narcotics.  WEIGHT BEARING Weight bearing as tolerated with assist device (walker, cane, etc) as directed, use it as long as suggested by your surgeon or therapist, typically at least 4-6 weeks.  POSTOPERATIVE CONSTIPATION PROTOCOL Constipation - defined medically as fewer than three stools per week and severe constipation as less than one stool per week.  One of the most common issues patients have following  surgery is constipation.  Even if you have a regular bowel pattern at home, your normal regimen is likely to be disrupted due to multiple reasons following surgery.  Combination of anesthesia, postoperative narcotics, change in appetite and fluid intake all can affect your bowels.  In order to avoid complications following surgery, here are some recommendations in order to help you during your recovery period.  Colace (docusate) - Pick up an over-the-counter form of  Colace or another stool softener and take twice a day as long as you are requiring postoperative pain medications.  Take with a full glass of water daily.  If you experience loose stools or diarrhea, hold the colace until you stool forms back up.  If your symptoms do not get better within 1 week or if they get worse, check with your doctor.  Dulcolax (bisacodyl) - Pick up over-the-counter and take as directed by the product packaging as needed to assist with the movement of your bowels.  Take with a full glass of water.  Use this product as needed if not relieved by Colace only.   MiraLax (polyethylene glycol) - Pick up over-the-counter to have on hand.  MiraLax is a solution that will increase the amount of water in your bowels to assist with bowel movements.  Take as directed and can mix with a glass of water, juice, soda, coffee, or tea.  Take if you go more than two days without a movement. Do not use MiraLax more than once per day. Call your doctor if you are still constipated or irregular after using this medication for 7 days in a row.  If you continue to have problems with postoperative constipation, please contact the office for further assistance and recommendations.  If you experience "the worst abdominal pain ever" or develop nausea or vomiting, please contact the office immediatly for further recommendations for treatment.  ITCHING  If you experience itching with your medications, try taking only a single pain pill, or even half a pain pill at a time.  You can also use Benadryl over the counter for itching or also to help with sleep.   TED HOSE STOCKINGS Wear the elastic stockings on both legs for three weeks following surgery during the day but you may remove then at night for sleeping.  MEDICATIONS See your medication summary on the After Visit Summary that the nursing staff will review with you prior to discharge.  You may have some home medications which will be placed on hold  until you complete the course of blood thinner medication.  It is important for you to complete the blood thinner medication as prescribed by your surgeon.  Continue your approved medications as instructed at time of discharge.  PRECAUTIONS If you experience chest pain or shortness of breath - call 911 immediately for transfer to the hospital emergency department.  If you develop a fever greater that 101 F, purulent drainage from wound, increased redness or drainage from wound, foul odor from the wound/dressing, or calf pain - CONTACT YOUR SURGEON.                                                   FOLLOW-UP APPOINTMENTS Make sure you keep all of your appointments after your operation with your surgeon and caregivers. You should call the office at the above phone number and make  an appointment for approximately two weeks after the date of your surgery or on the date instructed by your surgeon outlined in the "After Visit Summary".  RANGE OF MOTION AND STRENGTHENING EXERCISES  Rehabilitation of the knee is important following a knee injury or an operation. After just a few days of immobilization, the muscles of the thigh which control the knee become weakened and shrink (atrophy). Knee exercises are designed to build up the tone and strength of the thigh muscles and to improve knee motion. Often times heat used for twenty to thirty minutes before working out will loosen up your tissues and help with improving the range of motion but do not use heat for the first two weeks following surgery. These exercises can be done on a training (exercise) mat, on the floor, on a table or on a bed. Use what ever works the best and is most comfortable for you Knee exercises include:   Leg Lifts - While your knee is still immobilized in a splint or cast, you can do straight leg raises. Lift the leg to 60 degrees, hold for 3 sec, and slowly lower the leg. Repeat 10-20 times 2-3 times daily. Perform this exercise against  resistance later as your knee gets better.   Quad and Hamstring Sets - Tighten up the muscle on the front of the thigh (Quad) and hold for 5-10 sec. Repeat this 10-20 times hourly. Hamstring sets are done by pushing the foot backward against an object and holding for 5-10 sec. Repeat as with quad sets.   Leg Slides: Lying on your back, slowly slide your foot toward your buttocks, bending your knee up off the floor (only go as far as is comfortable). Then slowly slide your foot back down until your leg is flat on the floor again.  Angel Wings: Lying on your back spread your legs to the side as far apart as you can without causing discomfort.  A rehabilitation program following serious knee injuries can speed recovery and prevent re-injury in the future due to weakened muscles. Contact your doctor or a physical therapist for more information on knee rehabilitation.   IF YOU ARE TRANSFERRED TO A SKILLED REHAB FACILITY If the patient is transferred to a skilled rehab facility following release from the hospital, a list of the current medications will be sent to the facility for the patient to continue.  When discharged from the skilled rehab facility, please have the facility set up the patient's [place] prior to being released. Also, the skilled facility will be responsible for providing the patient with their medications at time of release from the facility to include their pain medication, the muscle relaxants, and their blood thinner medication. If the patient is still at the rehab facility at time of the two week follow up appointment, the skilled rehab facility will also need to assist the patient in arranging follow up appointment in our office and any transportation needs.  MAKE SURE YOU:   Understand these instructions.   Get help right away if you are not doing well or get worse.    Pick up stool softner and laxative for home use following surgery while on pain  medications. Do not submerge incision under water. Please use good hand washing techniques while changing dressing each day. May shower starting three days after surgery. Please use a clean towel to pat the incision dry following showers. Continue to use ice for pain and swelling after surgery. Do not use  any lotions or creams on the incision until instructed by your surgeon.  Information on my medicine - XARELTO (Rivaroxaban)  Why was Xarelto prescribed for you? Xarelto was prescribed for you to reduce the risk of blood clots forming after orthopedic surgery. The medical term for these abnormal blood clots is venous thromboembolism (VTE).  What do you need to know about xarelto ? Take your Xarelto ONCE DAILY at the same time every day. You may take it either with or without food.  If you have difficulty swallowing the tablet whole, you may crush it and mix in applesauce just prior to taking your dose.  Take Xarelto exactly as prescribed by your doctor and DO NOT stop taking Xarelto without talking to the doctor who prescribed the medication.  Stopping without other VTE prevention medication to take the place of Xarelto may increase your risk of developing a clot.  After discharge, you should have regular check-up appointments with your healthcare provider that is prescribing your Xarelto.    What do you do if you miss a dose? If you miss a dose, take it as soon as you remember on the same day then continue your regularly scheduled once daily regimen the next day. Do not take two doses of Xarelto on the same day.   Important Safety Information A possible side effect of Xarelto is bleeding. You should call your healthcare provider right away if you experience any of the following: ? Bleeding from an injury or your nose that does not stop. ? Unusual colored urine (red or dark brown) or unusual colored stools (red or black). ? Unusual bruising for unknown reasons. ? A serious  fall or if you hit your head (even if there is no bleeding).  Some medicines may interact with Xarelto and might increase your risk of bleeding while on Xarelto. To help avoid this, consult your healthcare provider or pharmacist prior to using any new prescription or non-prescription medications, including herbals, vitamins, non-steroidal anti-inflammatory drugs (NSAIDs) and supplements.  This website has more information on Xarelto: [email or web address]

## 2019-01-18 NOTE — Anesthesia Procedure Notes (Addendum)
Spinal  Patient location during procedure: OR Start time: 01/18/2019 1:22 PM End time: 01/18/2019 1:26 PM Staffing Resident/CRNA: Claudia Desanctis, CRNA Performed: resident/CRNA  Preanesthetic Checklist Completed: patient identified, site marked, surgical consent, pre-op evaluation, timeout performed, IV checked, risks and benefits discussed and monitors and equipment checked Spinal Block Patient position: sitting Prep: DuraPrep Patient monitoring: heart rate, cardiac monitor, continuous pulse ox and blood pressure Approach: midline Location: L3-4 Injection technique: single-shot Needle Needle type: Sprotte and Pencan  Needle gauge: 24 G Needle length: 10 cm Needle insertion depth: 8 cm Assessment Sensory level: T4

## 2019-01-18 NOTE — Anesthesia Procedure Notes (Addendum)
Anesthesia Regional Block: Adductor canal block   Pre-Anesthetic Checklist: ,, timeout performed, Correct Patient, Correct Site, Correct Laterality, Correct Procedure,, site marked, risks and benefits discussed, Surgical consent,  Pre-op evaluation,  At surgeon's request and post-op pain management  Laterality: Left  Prep: chloraprep       Needles:  Injection technique: Single-shot  Needle Type: Echogenic Stimulator Needle     Needle Length: 9cm  Needle Gauge: 21     Additional Needles:   Procedures:,,,, ultrasound used (permanent image in chart),,,,  Narrative:  Start time: 01/18/2019 12:50 PM End time: 01/18/2019 1:00 PM Injection made incrementally with aspirations every 5 mL.  Performed by: Personally  Anesthesiologist: Murvin Natal, MD  Additional Notes: Functioning IV was confirmed and monitors were applied. A time-out was performed. Hand hygiene and sterile gloves were used. The thigh was placed in a frog-leg position and prepped in a sterile fashion. A 33mm 21ga Arrow echogenic stimulator needle was placed using ultrasound guidance.  Negative aspiration and negative test dose prior to incremental administration of local anesthetic. The patient tolerated the procedure well.

## 2019-01-18 NOTE — Interval H&P Note (Signed)
History and Physical Interval Note:  01/18/2019 11:39 AM  Chelsea Fitzgerald  has presented today for surgery, with the diagnosis of medial compartment osteoarthritis left knee.  The various methods of treatment have been discussed with the patient and family. After consideration of risks, benefits and other options for treatment, the patient has consented to  Procedure(s) with comments: Left knee medial unicompartmental arthroplasty (Left) - 38min as a surgical intervention.  The patient's history has been reviewed, patient examined, no change in status, stable for surgery.  I have reviewed the patient's chart and labs.  Questions were answered to the patient's satisfaction.     Pilar Plate Chelsea Fitzgerald

## 2019-01-18 NOTE — Anesthesia Postprocedure Evaluation (Signed)
Anesthesia Post Note  Patient: Chelsea Fitzgerald  Procedure(s) Performed: Left knee medial unicompartmental arthroplasty (Left Knee)     Patient location during evaluation: PACU Anesthesia Type: Regional and Spinal Level of consciousness: oriented and awake and alert Pain management: pain level controlled Vital Signs Assessment: post-procedure vital signs reviewed and stable Respiratory status: spontaneous breathing, respiratory function stable and patient connected to nasal cannula oxygen Cardiovascular status: blood pressure returned to baseline and stable Postop Assessment: no headache, no backache, no apparent nausea or vomiting and spinal receding Anesthetic complications: no    Last Vitals:  Vitals:   01/18/19 1611 01/18/19 1721  BP: (!) 144/81 126/71  Pulse: 62 64  Resp: 16 18  Temp: 36.8 C 36.7 C  SpO2: 100% 98%    Last Pain:  Vitals:   01/18/19 1721  TempSrc: Oral  PainSc:                  Ryan P Ellender

## 2019-01-18 NOTE — Evaluation (Signed)
Physical Therapy Evaluation Patient Details Name: Chelsea Fitzgerald MRN: [id number] DOB: 03-24-1946 Today's Date: 01/18/2019   History of Present Illness  Patient is 73 y.o. female s/p Lt unicompartmental knee arthroplasty on 01/18/19 with PMH significnat for HTN, HLD, CAD, STEMI, OA, depression, and anxiety.    Clinical Impression  Chelsea Fitzgerald is a 73 y.o. female POD 0 s/p Lt partial knee arthroplasty. Patient reports independence with mobility at baseline with some use of rolattor. Patient is now limited by functional impairments (see PT problem list below) and requires min assist for transfers and gait with RW. Patient was able to ambulate ~50 feet with RW and min assist to steady and cue for safe step pattern. Patient instructed in exercise to facilitate ROM and circulation to manage edema. Patient will benefit from continued skilled PT interventions to address impairments and progress towards PLOF. Acute PT will follow to progress mobility and stair training in preparation for safe discharge home.     Follow Up Recommendations Follow surgeon's recommendation for DC plan and follow-up therapies    Equipment Recommendations  Rolling walker with 5" wheels(pt educated on where/how to obtain shower seat)    Recommendations for Other Services       Precautions / Restrictions Precautions Precautions: Fall Restrictions Weight Bearing Restrictions: No      Mobility  Bed Mobility Overal bed mobility: Needs Assistance Bed Mobility: Supine to Sit     Supine to sit: Supervision;HOB elevated     General bed mobility comments: no cues required and no assist for LE mobility, pt using bed rails  Transfers Overall transfer level: Needs assistance Equipment used: Rolling walker (2 wheeled);Straight cane Transfers: Sit to/from Stand Sit to Stand: Min assist         General transfer comment: cues for safe hand placement and technique with RW, min assist to initiate power up and steady  on rising.  Ambulation/Gait Ambulation/Gait assistance: Min assist Gait Distance (Feet): 50 Feet Assistive device: Rolling walker (2 wheeled) Gait Pattern/deviations: Decreased step length - left;Decreased step length - right;Decreased stride length;Step-to pattern;Narrow base of support Gait velocity: decreased   General Gait Details: cues to encourage knee extension throughout gait and to facilitate wider BOS as pt had tendency to adduct Lt LE with stepping. close chair follow provided for safety. Pt with slight buckling at end of gait prior to sitting in chair, pt able to support herself with UE support on RW and assist from therapist.  Stairs            Wheelchair Mobility    Modified Rankin (Stroke Patients Only)       Balance Overall balance assessment: Needs assistance Sitting-balance support: Feet supported;No upper extremity supported Sitting balance-Leahy Scale: Good     Standing balance support: Bilateral upper extremity supported;During functional activity Standing balance-Leahy Scale: Poor              Pertinent Vitals/Pain Pain Assessment: No/denies pain    Home Living Family/patient expects to be discharged to:: Private residence Living Arrangements: Spouse/significant other Available Help at Discharge: Family Type of Home: House Home Access: Stairs to enter Entrance Stairs-Rails: Right Entrance Stairs-Number of Steps: 4 stairs at the garage Home Layout: One level Home Equipment: Environmental consultant - 4 wheels;Cane - single point      Prior Function Level of Independence: Independent               Hand Dominance   Dominant Hand: Right    Extremity/Trunk Assessment  Upper Extremity Assessment Upper Extremity Assessment: Overall WFL for tasks assessed    Lower Extremity Assessment Lower Extremity Assessment: Generalized weakness;LLE deficits/detail LLE Deficits / Details: pt with good supine quad activation, mild extensor lag with SLR, pt  able to maintain LAQ with mild resistance LLE Sensation: decreased proprioception;history of peripheral neuropathy LLE Coordination: decreased gross motor    Cervical / Trunk Assessment Cervical / Trunk Assessment: Normal  Communication   Communication: No difficulties  Cognition Arousal/Alertness: Awake/alert Behavior During Therapy: WFL for tasks assessed/performed Overall Cognitive Status: Within Functional Limits for tasks assessed             General Comments      Exercises Total Joint Exercises Ankle Circles/Pumps: AAROM;15 reps;Seated;Both Quad Sets: AROM;10 reps;Supine;Seated;Left   Assessment/Plan    PT Assessment Patient needs continued PT services  PT Problem List Decreased strength;Decreased balance;Decreased mobility;Decreased range of motion;Decreased activity tolerance;Decreased knowledge of use of DME       PT Treatment Interventions DME instruction;Balance training;Functional mobility training;Patient/family education;Modalities;Gait training;Therapeutic activities;Therapeutic exercise;Stair training    PT Goals (Current goals can be found in the Care Plan section)  Acute Rehab PT Goals Patient Stated Goal: can't wait to walk normal again PT Goal Formulation: With patient Time For Goal Achievement: 01/25/19 Potential to Achieve Goals: Good    Frequency 7X/week    AM-PAC PT "6 Clicks" Mobility  Outcome Measure Help needed turning from your back to your side while in a flat bed without using bedrails?: A Little Help needed moving from lying on your back to sitting on the side of a flat bed without using bedrails?: A Little Help needed moving to and from a bed to a chair (including a wheelchair)?: A Little Help needed standing up from a chair using your arms (e.g., wheelchair or bedside chair)?: A Little Help needed to walk in hospital room?: A Little Help needed climbing 3-5 steps with a railing? : A Lot 6 Click Score: 17    End of Session  Equipment Utilized During Treatment: Gait belt Activity Tolerance: Patient tolerated treatment well Patient left: in chair;with call bell/phone within reach;with chair alarm set Nurse Communication: Mobility status PT Visit Diagnosis: Muscle weakness (generalized) (M62.81);Difficulty in walking, not elsewhere classified (R26.2)    Time: [id number] PT Time Calculation (min) (ACUTE ONLY): 18 min   Charges:   PT Evaluation $PT Eval Low Complexity: 1 Low         Kipp Brood, PT, DPT Physical Therapist with [facility]  01/18/2019 6:48 PM

## 2019-01-18 NOTE — Op Note (Signed)
OPERATIVE REPORT-UNICOMPARTMENTAL ARTHROPLASTY  PREOPERATIVE DIAGNOSIS: Medial compartment osteoarthritis, Left knee  POSTOPERATIVE DIAGNOSIS: Medial compartment osteoarthritis, Left knee  PROCEDURE:Left knee medial unicompartmental arthroplasty. ([facility])  SURGEON: Gaynelle Arabian, Chelsea Fitzgerald   ASSISTANT: Griffith Citron, PA-C  ANESTHESIA:  Adductor canal block and spinal.   ESTIMATED BLOOD LOSS: Minimal.   DRAINS: Hemovac x1.   TOURNIQUET TIME:  Total Tourniquet Time Documented: Thigh (Left) - 31 minutes Total: Thigh (Left) - 31 minutes   COMPLICATIONS: None.   CONDITION: Stable to recovery.   BRIEF CLINICAL NOTE:  Chelsea Fitzgerald is a 73 y.o. female , who has  significant isolated medial compartment arthritis of the Left knee. The patient has had nonoperative management including injections of cortisone. Unfortunately, the pain persists.  Radiograph showed isolated medial compartment bone-on-bone arthritis  with normal-appearing patellofemoral and lateral compartments. The patient presents now for left knee unicompartmental arthroplasty.   PROCEDURE IN DETAIL: After successful administration of  Adductor canal block and spinal anesthetic, a tourniquet was placed high on the  Left thigh and the Left lower extremity prepped and draped in usual sterile fashion. Extremity was wrapped in an Esmarch, knee flexed, and tourniquet inflated to 300 mmHg.       A midline incision was made with a 10 blade through subcutaneous  tissue to the extensor mechanism. A fresh blade was used to make a  medial parapatellar arthrotomy. Soft tissue on the proximal medial  tibia subperiosteally elevated to the joint line with a knife and into  the semimembranosus bursa with a Cobb elevator. The patella was  subluxed laterally, and the knee flexed 90 degrees. The ACL was intact.  The marginal osteophytes on the medial femur and tibia were removed with  a rongeur. The medial meniscus was also removed. The  extramedullary tibial cutting guide was placed referencing Proximally at the medial aspect of the tibial tubercle and distally along the 2nd metatarsal axis. 6 degrees of posterior slope was dialed in and the block was pinned to remove 4 mm from the medial tibial surface.The cut is made with an oscillating saw and the cut bone removed.      The 9 mm spacer was then placed with the knee in extension.The distal femoral cutting guide was attached to the spacer in extension and pinned to make the distal femur cut with an oscillating saw. The trial size 3 cutting block is placed and is most appropriate. The femoral preparation is completed with the posterior and chamfer cuts and drilling of the 2 lug holes. The trial size 3 femur is placed with excellent fit. The 9 mm spacer is placed and there is excellent balance through full range of motion.  The trial and the spacer are removed and tibia addressed.      The tibial sizer is placed and size C is most appropriate. The proximal tibia is prepared with the drill holes and keel for the size C. The size C implant is placed with excellent fit. The trials are removed and cut bone surfaces prepared with pulsatile lavage. The cement is mixed and once ready for implantation The size C tibia and size 3 femur are cemented into place and all extruded cement removed. The 9 mm insert is then placed into the tibial tray  and locked into position. The knee is placed through a full range of motion with excellent stability.           I then injected the extensor mechanism, periosteum of  the femur and subcu  tissues, a total of 20 mL of Exparel mixed with 60  mL of saline. Wound was copiously irrigated with saline solution, and the arthrotomy closed over a Hemovac drain with a running #1 Stratofix  suture. The subcutaneous was closed with  interrupted 2-0 Vicryl and subcuticular running 4-0 Monocryl. The drain  was hooked to suction. Incision cleaned and dried and  Steri-Strips and  a bulky sterile dressing applied. The tourniquet was released after a  total time of 31 minutes. This was done after closing the extensor  mechanism. The wound was closed and a bulky sterile dressing was  applied and the patient awakened and transported to recovery room in stable condition.       Please note that a surgical assistant was a medical necessity for this  procedure in order to perform it in a safe and expeditious manner.  Assistance was necessary for retracting vital ligaments, neurovascular  structures, as well as for proper positioning of the limb to allow for  appropriate bone cuts and appropriate placement of the prosthesis.    Chelsea Plover Breta Demedeiros, Chelsea Fitzgerald

## 2019-01-18 NOTE — Progress Notes (Signed)
Assisted Dr. Ellender with left, ultrasound guided, adductor canal block. Side rails up, monitors on throughout procedure. See vital signs in flow sheet. Tolerated Procedure well.  

## 2019-01-19 DIAGNOSIS — M1712 Unilateral primary osteoarthritis, left knee: Secondary | ICD-10-CM | POA: Diagnosis not present

## 2019-01-19 LAB — CBC
HCT: 38.1 % (ref 36.0–46.0)
Hemoglobin: 11.7 g/dL — ABNORMAL LOW (ref 12.0–15.0)
MCH: 29.9 pg (ref 26.0–34.0)
MCHC: 30.7 g/dL (ref 30.0–36.0)
MCV: 97.4 fL (ref 80.0–100.0)
Platelets: 214 10*3/uL (ref 150–400)
RBC: 3.91 MIL/uL (ref 3.87–5.11)
RDW: 13.6 % (ref 11.5–15.5)
WBC: 7.3 10*3/uL (ref 4.0–10.5)
nRBC: 0 % (ref 0.0–0.2)

## 2019-01-19 LAB — BASIC METABOLIC PANEL
Anion gap: 9 (ref 5–15)
BUN: 26 mg/dL — ABNORMAL HIGH (ref 8–23)
CO2: 23 mmol/L (ref 22–32)
Calcium: 8.7 mg/dL — ABNORMAL LOW (ref 8.9–10.3)
Chloride: 104 mmol/L (ref 98–111)
Creatinine, Ser: 1.04 mg/dL — ABNORMAL HIGH (ref 0.44–1.00)
GFR calc Af Amer: >60 mL/min (ref >60)
GFR calc non Af Amer: 53 mL/min — ABNORMAL LOW (ref >60)
Glucose, Bld: 143 mg/dL — ABNORMAL HIGH (ref 70–99)
Potassium: 4.5 mmol/L (ref 3.5–5.1)
Sodium: 136 mmol/L (ref 135–145)

## 2019-01-19 MED ORDER — GABAPENTIN 100 MG PO CAPS: 200.0000 mg | ORAL_CAPSULE | Freq: Three times a day (TID) | ORAL | 0 refills | Status: DC

## 2019-01-19 MED ORDER — OXYCODONE HCL 5 MG PO TABS: 5.0000 mg | ORAL_TABLET | Freq: Four times a day (QID) | ORAL | 0 refills | Status: DC | PRN

## 2019-01-19 MED ORDER — RIVAROXABAN 10 MG PO TABS: 10.0000 mg | ORAL_TABLET | Freq: Every day | ORAL | 0 refills | Status: DC

## 2019-01-19 MED ORDER — METHOCARBAMOL 500 MG PO TABS: 500.0000 mg | ORAL_TABLET | Freq: Four times a day (QID) | ORAL | 0 refills | Status: DC | PRN

## 2019-01-19 NOTE — Progress Notes (Signed)
01/19/19 1400  PT Visit Information  Last PT Received On 01/19/19  Ready to d/c from PT standpoint; recommend close supervision/assist for mobility for safety and fall prevention. Pt reports mild dizziness which does not change with activity; husband aware and feels comfortable with assist; reviewed use of gait belt   Assistance Needed +1  History of Present Illness Patient is 73 y.o. female s/p Lt unicompartmental knee arthroplasty on 01/18/19 with PMH significnat for HTN, HLD, CAD, STEMI, OA, depression, and anxiety.  Subjective Data  Patient Stated Goal can't wait to walk normal again  Precautions  Precautions Fall  Precaution Comments inde SLRs, KI not utilized  Required Braces or Orthoses Knee Immobilizer - Left  Knee Immobilizer - Left Discontinue once straight leg raise with < 10 degree lag  Restrictions  Weight Bearing Restrictions No  Pain Assessment  Pain Assessment 0-10  Pain Score 4  Pain Location left knee  Pain Descriptors / Indicators Discomfort;Sore  Pain Intervention(s) Limited activity within patient's tolerance;Monitored during session;Repositioned;Ice applied  Cognition  Arousal/Alertness Awake/alert  Behavior During Therapy WFL for tasks assessed/performed  Overall Cognitive Status Within Functional Limits for tasks assessed  Transfers  Overall transfer level Needs assistance  Equipment used Rolling walker (2 wheeled);Straight cane  Transfers Sit to/from Stand  Sit to [employer] transfer comment cues for safe hand placement and technique with RW  Ambulation/Gait  Ambulation/Gait assistance Min guard;Min assist  Gait Distance (Feet) 70 Feet  Assistive device Rolling walker (2 wheeled)  Gait Pattern/deviations Step-to pattern;Decreased stance time - left;Wide base of support  General Gait Details cues to maintain wider BOS for incr stability, for step length and RW position. slow but steady gait, no overt LOB  Gait velocity  decreased  Stairs Yes  Stairs assistance Min guard;Min assist  Stair Management One rail Right;One rail Left;Step to pattern;Sideways  Number of Stairs 5 (x2)  General stair comments cues for sequence and technique; husband present for stairs, pt and husband able to return demo  Total Joint Exercises  Goniometric ROM  (reviewed hand out for HEP for later practice )  PT - End of Session  Equipment Utilized During Treatment Gait belt  Activity Tolerance Patient tolerated treatment well  Patient left in chair;with call bell/phone within reach;with chair alarm set;with family/visitor present  Nurse Communication Mobility status   PT - Assessment/Plan  PT Plan Current plan remains appropriate  PT Visit Diagnosis Muscle weakness (generalized) (M62.81);Difficulty in walking, not elsewhere classified (R26.2)  PT Frequency (ACUTE ONLY) 7X/week  Follow Up Recommendations Follow surgeon's recommendation for DC plan and follow-up therapies  PT equipment Rolling walker with 5" wheels  AM-PAC PT "6 Clicks" Mobility Outcome Measure (Version 2)  Help needed turning from your back to your side while in a flat bed without using bedrails? 3  Help needed moving from lying on your back to sitting on the side of a flat bed without using bedrails? 3  Help needed moving to and from a bed to a chair (including a wheelchair)? 3  Help needed standing up from a chair using your arms (e.g., wheelchair or bedside chair)? 3  Help needed to walk in hospital room? 3  Help needed climbing 3-5 steps with a railing?  3  6 Click Score 18  Consider Recommendation of Discharge To: Home with [facility]  PT Goal Progression  Progress towards PT goals Progressing toward goals  Acute Rehab PT Goals  PT Goal Formulation With patient  Time For  Goal Achievement 01/25/19  Potential to Achieve Goals Good  PT Time Calculation  PT Start Time (ACUTE ONLY) 1408  PT Stop Time (ACUTE ONLY) 1423  PT Time Calculation (min) (ACUTE ONLY) 15  min  PT General Charges  $$ ACUTE PT VISIT 1 Visit  PT Treatments  $Gait Training 8-22 mins

## 2019-01-19 NOTE — Progress Notes (Addendum)
   Subjective: 1 Day Post-Op Procedure(s) (LRB): Left knee medial unicompartmental arthroplasty (Left) Patient reports pain as mild.   Patient seen in rounds by Dr. Wynelle Link. Patient is well, and has had no acute complaints or problems other than discomfort in the left knee. No acute events overnight. Patient states she is ready to go home today. Foley catheter removed, positive flatus. Denies CP, SHOB. We will continue therapy today.   Objective: Vital signs in last 24 hours: Temp:  [97 F (36.1 C)-98.3 F (36.8 C)] 97.5 F (36.4 C) (10/20 0537) Pulse Rate:  [56-77] 63 (10/20 0537) Resp:  [14-20] 16 (10/20 0537) BP: (107-152)/(62-88) 117/68 (10/20 0537) SpO2:  [92 %-100 %] 99 % (10/20 0537) Weight:  [75.8 kg] 75.8 kg (10/19 1635)  Intake/Output from previous day:  Intake/Output Summary (Last 24 hours) at 01/19/2019 0706 Last data filed at 01/19/2019 0537 Gross per 24 hour  Intake 3111.67 ml  Output 1905 ml  Net 1206.67 ml     Intake/Output this shift: No intake/output data recorded.  Labs: Recent Labs    01/19/19 0308  HGB 11.7*   Recent Labs    01/19/19 0308  WBC 7.3  RBC 3.91  HCT 38.1  PLT 214   Recent Labs    01/19/19 0308  NA 136  K 4.5  CL 104  CO2 23  BUN 26*  CREATININE 1.04*  GLUCOSE 143*  CALCIUM 8.7*   No results for input(s): LABPT, INR in the last 72 hours.  Exam: General - Patient is Alert Extremity - Neurologically intact Sensation intact distally Intact pulses distally Dorsiflexion/Plantar flexion intact Dressing - dressing C/D/I Motor Function - intact, moving foot and toes well on exam.   Past Medical History:  Diagnosis Date  . Anxiety   . Arthritis   . Carotid stenosis, bilateral   . Coronary artery disease   . Depression   . Hyperlipidemia   . Hypertension   . LBBB (left bundle branch block) 12/28/2018   Noted on EKG  . Migraines   . Neuropathy   . STEMI (ST elevation myocardial infarction) ([place])   . SUI (stress  urinary incontinence, female)   . Uterine prolapse   . Varicose veins     Assessment/Plan: 1 Day Post-Op Procedure(s) (LRB): Left knee medial unicompartmental arthroplasty (Left) Principal Problem:   OA (osteoarthritis) of knee  Estimated body mass index is 30.58 kg/m as calculated from the following:   Height as of this encounter: 5\' 2"  (1.575 m).   Weight as of this encounter: 75.8 kg. Advance diet Up with therapy D/C IV fluids  DVT Prophylaxis - Xarelto Weight bearing as tolerated. D/C O2 and pulse ox and try on room air. Hemovac pulled without difficulty, will begin therapy today.  Plan is to go Home after hospital stay. Plan for discharge following 1 session of therapy as long as she is meeting her goals. Scheduled for OPPT. Follow up in the office in 2 weeks.  Griffith Citron, PA-C Orthopedic Surgery [phone] 01/19/2019, 7:06 AM

## 2019-01-19 NOTE — Progress Notes (Signed)
Physical Therapy Treatment Patient Details Name: Chelsea Fitzgerald MRN: [id number] DOB: Nov 10, 1945 Today's Date: 01/19/2019    History of Present Illness Patient is 73 y.o. female s/p Lt unicompartmental knee arthroplasty on 01/18/19 with PMH significnat for HTN, HLD, CAD, STEMI, OA, depression, and anxiety.    PT Comments    Pt progressing well. Will see again in pm to review stairs. Pt should be ready for d/c after pm session   Follow Up Recommendations  Follow surgeon's recommendation for DC plan and follow-up therapies     Equipment Recommendations  Rolling walker with 5" wheels    Recommendations for Other Services       Precautions / Restrictions Precautions Precautions: Fall Precaution Comments: inde SLRs, KI not utilized Required Braces or Orthoses: Knee Immobilizer - Left Knee Immobilizer - Left: Discontinue once straight leg raise with < 10 degree lag Restrictions Weight Bearing Restrictions: No    Mobility  Bed Mobility Overal bed mobility: Needs Assistance Bed Mobility: Supine to Sit     Supine to sit: Supervision;HOB elevated     General bed mobility comments: for safety  Transfers Overall transfer level: Needs assistance Equipment used: Rolling walker (2 wheeled);Straight cane Transfers: Sit to/from Stand Sit to Stand: Min assist;Min guard         General transfer comment: cues for safe hand placement and technique with RW, min assist to initiate power up and steady on rising.  Ambulation/Gait Ambulation/Gait assistance: Min guard;Min assist Gait Distance (Feet): 120 Feet Assistive device: Rolling walker (2 wheeled) Gait Pattern/deviations: Step-to pattern;Decreased stance time - left;Wide base of support     General Gait Details: cues to maintain wider BOS for incr stability, for step length and RW position. slow but steady gait, no overt LOB   Stairs             Wheelchair Mobility    Modified Rankin (Stroke Patients Only)        Balance                                            Cognition Arousal/Alertness: Awake/alert Behavior During Therapy: WFL for tasks assessed/performed Overall Cognitive Status: Within Functional Limits for tasks assessed                                        Exercises Total Joint Exercises Ankle Circles/Pumps: AROM;Both;10 reps Quad Sets: AROM;Both;10 reps Short Arc Quad: AROM;Left;10 reps Heel Slides: AAROM;Left;10 reps Hip ABduction/ADduction: AROM;Left;10 reps Straight Leg Raises: AROM;Left;10 reps Goniometric ROM: ~ 5 to 70 degrees left knee flexion AAROM    General Comments        Pertinent Vitals/Pain Pain Assessment: 0-10 Pain Score: 4  Pain Location: left knee Pain Descriptors / Indicators: Discomfort;Sore Pain Intervention(s): Limited activity within patient's tolerance;Monitored during session;Repositioned    Home Living                      Prior Function            PT Goals (current goals can now be found in the care plan section) Acute Rehab PT Goals Patient Stated Goal: can't wait to walk normal again PT Goal Formulation: With patient Time For Goal Achievement: 01/25/19 Potential to Achieve Goals: Good Progress towards PT goals:  Progressing toward goals    Frequency    7X/week      PT Plan Current plan remains appropriate    Co-evaluation              AM-PAC PT "6 Clicks" Mobility   Outcome Measure  Help needed turning from your back to your side while in a flat bed without using bedrails?: A Little Help needed moving from lying on your back to sitting on the side of a flat bed without using bedrails?: A Little Help needed moving to and from a bed to a chair (including a wheelchair)?: A Little Help needed standing up from a chair using your arms (e.g., wheelchair or bedside chair)?: A Little Help needed to walk in hospital room?: A Little Help needed climbing 3-5 steps with a  railing? : A Lot 6 Click Score: 17    End of Session Equipment Utilized During Treatment: Gait belt Activity Tolerance: Patient tolerated treatment well Patient left: in chair;with call bell/phone within reach;with chair alarm set Nurse Communication: Mobility status PT Visit Diagnosis: Muscle weakness (generalized) (M62.81);Difficulty in walking, not elsewhere classified (R26.2)     Time: [id number] PT Time Calculation (min) (ACUTE ONLY): 23 min  Charges:  $Gait Training: 8-22 mins $Therapeutic Exercise: 8-22 mins                     Kenyon Ana, PT  Pager: [phone] Acute Rehab Dept [facility]): [phone]   01/19/2019    [facility] 01/19/2019, 10:48 AM

## 2019-01-19 NOTE — Plan of Care (Signed)
°  Problem: Education: °Goal: Knowledge of General Education information will improve °Description: Including pain rating scale, medication(s)/side effects and non-pharmacologic comfort measures °Outcome: Progressing °  °Problem: Clinical Measurements: °Goal: Ability to maintain clinical measurements within normal limits will improve °Outcome: Progressing °  °Problem: Activity: °Goal: Risk for activity intolerance will decrease °Outcome: Progressing °  °Problem: Nutrition: °Goal: Adequate nutrition will be maintained °Outcome: Progressing °  °Problem: Coping: °Goal: Level of anxiety will decrease °Outcome: Progressing °  °Problem: Pain Managment: °Goal: General experience of comfort will improve °Outcome: Progressing °  °

## 2019-01-19 NOTE — Plan of Care (Signed)
Pt ready for D\C

## 2019-01-27 ENCOUNTER — Encounter (HOSPITAL_COMMUNITY): Payer: Self-pay | Admitting: Orthopedic Surgery

## 2019-05-26 ENCOUNTER — Other Ambulatory Visit: Payer: Self-pay | Admitting: Family Medicine

## 2019-09-22 ENCOUNTER — Ambulatory Visit: Payer: Medicare HMO | Admitting: Dermatology

## 2019-09-22 ENCOUNTER — Other Ambulatory Visit: Payer: Self-pay

## 2019-09-22 DIAGNOSIS — L719 Rosacea, unspecified: Secondary | ICD-10-CM | POA: Diagnosis not present

## 2019-09-22 DIAGNOSIS — L821 Other seborrheic keratosis: Secondary | ICD-10-CM | POA: Diagnosis not present

## 2019-09-22 DIAGNOSIS — L820 Inflamed seborrheic keratosis: Secondary | ICD-10-CM

## 2019-09-22 MED ORDER — METRONIDAZOLE 0.75 % EX CREA: TOPICAL_CREAM | Freq: Two times a day (BID) | CUTANEOUS | 2 refills | Status: DC

## 2019-09-22 NOTE — Patient Instructions (Addendum)
Recommend daily broad spectrum sunscreen SPF 30+ to sun-exposed areas, reapply every 2 hours as needed. Call for new or changing lesions.  Cryotherapy Aftercare   Wash gently with soap and water everyday.    Apply Vaseline and Band-Aid daily until healed.  Seborrheic Keratosis  What causes seborrheic keratoses? Seborrheic keratoses are harmless, common skin growths that first appear during adult life.  As time goes by, more growths appear.  Some people may develop a large number of them.  Seborrheic keratoses appear on both covered and uncovered body parts.  They are not caused by sunlight.  The tendency to develop seborrheic keratoses can be inherited.  They vary in color from skin-colored to gray, brown, or even black.  They can be either smooth or have a rough, warty surface.   Seborrheic keratoses are superficial and look as if they were stuck on the skin.  Under the microscope this type of keratosis looks like layers upon layers of skin.  That is why at times the top layer may seem to fall off, but the rest of the growth remains and re-grows.    Treatment Seborrheic keratoses do not need to be treated, but can easily be removed in the office.  Seborrheic keratoses often cause symptoms when they rub on clothing or jewelry.  Lesions can be in the way of shaving.  If they become inflamed, they can cause itching, soreness, or burning.  Removal of a seborrheic keratosis can be accomplished by freezing, burning, or surgery. If any spot bleeds, scabs, or grows rapidly, please return to have it checked, as these can be an indication of a skin cancer.

## 2019-09-22 NOTE — Progress Notes (Signed)
   Follow-Up Visit   Subjective  Chelsea Fitzgerald is a 74 y.o. female who presents for the following: Skin Problem.  Patient here today for a few spots she would like checked. Red spot at right arm, may have been treated with LN2 previously, feels funny when the sun hits it. Also a spot on the right thigh and a few on the face. Patient had shingles about 1 month ago. No history of skin cancer.   The following portions of the chart were reviewed this encounter and updated as appropriate:      Review of Systems:  No other skin or systemic complaints except as noted in HPI or Assessment and Plan.  Objective  Well appearing patient in no apparent distress; mood and affect are within normal limits.  A focused examination was performed including face, arms, legs. Relevant physical exam findings are noted in the Assessment and Plan.  Objective  Right Upper Forearm x 1: Erythematous keratotic flat papule x 2, treated in past with LN2 per patient  Objective  Right Thigh: Stuck-on, waxy, tan-brown papule -- Discussed benign etiology and prognosis.   Objective  face: Erythema and telangiectasias on cheeks and nose   Assessment & Plan  Inflamed seborrheic keratosis Right Upper Forearm x 1  Recheck on follow up. Consider biopsy if not improved  Destruction of lesion - Right Upper Forearm x 1  Destruction method: cryotherapy   Informed consent: discussed and consent obtained   Lesion destroyed using liquid nitrogen: Yes   Region frozen until ice ball extended beyond lesion: Yes   Outcome: patient tolerated procedure well with no complications   Post-procedure details: wound care instructions given    Seborrheic keratosis Right Thigh  Benign, observe.    Rosacea face  Start metronidazole cream 0.75% QHS   Recommend BBL to treat telangiectasias. Multiple treatments would be necessary for best results.  Ordered Medications: metroNIDAZOLE (METROCREAM) 0.75 % cream  Return  in 3 months (on 12/23/2019) for TBSE, BBL with Sonia Baller.  Graciella Belton, RMA, am acting as scribe for Brendolyn Patty, MD .  Documentation: I have reviewed the above documentation for accuracy and completeness, and I agree with the above.  Brendolyn Patty MD

## 2019-10-11 ENCOUNTER — Ambulatory Visit: Payer: Medicare HMO

## 2019-10-20 ENCOUNTER — Other Ambulatory Visit: Payer: Self-pay | Admitting: Family Medicine

## 2019-10-20 DIAGNOSIS — Z1231 Encounter for screening mammogram for malignant neoplasm of breast: Secondary | ICD-10-CM

## 2019-11-11 ENCOUNTER — Other Ambulatory Visit: Payer: Self-pay

## 2019-11-11 ENCOUNTER — Ambulatory Visit
Admission: RE | Admit: 2019-11-11 | Discharge: 2019-11-11 | Disposition: A | Discharge status: Home / Self Care | Payer: Medicare HMO | Source: Ambulatory Visit | Attending: Family Medicine | Admitting: Family Medicine

## 2019-11-11 DIAGNOSIS — Z1231 Encounter for screening mammogram for malignant neoplasm of breast: Secondary | ICD-10-CM | POA: Diagnosis not present

## 2019-12-29 ENCOUNTER — Encounter: Payer: Self-pay | Admitting: Dermatology

## 2019-12-30 ENCOUNTER — Other Ambulatory Visit: Payer: Self-pay

## 2019-12-30 ENCOUNTER — Encounter: Payer: Self-pay | Admitting: Dermatology

## 2019-12-30 ENCOUNTER — Ambulatory Visit (INDEPENDENT_AMBULATORY_CARE_PROVIDER_SITE_OTHER): Payer: Medicare HMO | Admitting: Dermatology

## 2019-12-30 DIAGNOSIS — L570 Actinic keratosis: Secondary | ICD-10-CM | POA: Diagnosis not present

## 2019-12-30 DIAGNOSIS — L719 Rosacea, unspecified: Secondary | ICD-10-CM | POA: Diagnosis not present

## 2019-12-30 DIAGNOSIS — L814 Other melanin hyperpigmentation: Secondary | ICD-10-CM

## 2019-12-30 DIAGNOSIS — L821 Other seborrheic keratosis: Secondary | ICD-10-CM | POA: Diagnosis not present

## 2019-12-30 DIAGNOSIS — Z1283 Encounter for screening for malignant neoplasm of skin: Secondary | ICD-10-CM

## 2019-12-30 DIAGNOSIS — D229 Melanocytic nevi, unspecified: Secondary | ICD-10-CM

## 2019-12-30 DIAGNOSIS — L578 Other skin changes due to chronic exposure to nonionizing radiation: Secondary | ICD-10-CM

## 2019-12-30 DIAGNOSIS — D1800 Hemangioma unspecified site: Secondary | ICD-10-CM

## 2019-12-30 NOTE — Progress Notes (Signed)
Follow-Up Visit   Subjective  Chelsea Fitzgerald is a 74 y.o. female who presents for the following: TBSE.  Patient here for full body skin exam and skin cancer screening. Patient has no h/o skin cancer and has not noted new or changing lesions. She is interested in treatment for rosacea at her face. She is not using anything currently.   The following portions of the chart were reviewed this encounter and updated as appropriate:  Tobacco  Allergies  Meds  Problems  Med Hx  Surg Hx  Fam Hx      Review of Systems:  No other skin or systemic complaints except as noted in HPI or Assessment and Plan.  Objective  Well appearing patient in no apparent distress; mood and affect are within normal limits.  A full examination was performed including scalp, head, eyes, ears, nose, lips, neck, chest, axillae, abdomen, back, buttocks, bilateral upper extremities, bilateral lower extremities, hands, feet, fingers, toes, fingernails, and toenails. All findings within normal limits unless otherwise noted below.  Objective  Left Forearm, Left Hand Dorsum x 3 (3), Left Upper Back, Right Chest x 2 (2), Right Forearm: Erythematous thin papules/macules with gritty scale.   Objective  mid face: Mid face erythema with many telangiectasias and rare inflammatory papule   Assessment & Plan  AK (actinic keratosis) (8) Left Hand Dorsum x 3 (3); Left Forearm; Right Forearm; Right Chest x 2 (2); Left Upper Back  Cryotherapy today Prior to procedure, discussed risks of blister formation, small wound, skin dyspigmentation, or rare scar following cryotherapy.    Destruction of lesion - Left Forearm, Left Hand Dorsum x 3, Left Upper Back, Right Chest x 2, Right Forearm  Destruction method: cryotherapy   Informed consent: discussed and consent obtained   Lesion destroyed using liquid nitrogen: Yes   Outcome: patient tolerated procedure well with no complications   Post-procedure details: wound care  instructions given    Rosacea mid face  Chronic, flared Discussed [facility] and BBL treatment.  Will give samples of Soolantra, and Rhofade. patient will call if she would like a prescription of Soolantra or the [facility] rosacea cream Will send prescription to [facility] Start [facility] metronidazole/ivermectin/azelaic acid twice daily as needed to affected areas on the face. The patient was advised this is not covered by insurance. They will receive an email to check out and the medication will be mailed to their home.    Other Related Medications metroNIDAZOLE (METROCREAM) 0.75 % cream   Lentigines - Scattered tan macules - Discussed due to sun exposure - Benign, observe - Call for any changes  Seborrheic Keratoses - Stuck-on, waxy, tan-brown papules and plaques  - Discussed benign etiology and prognosis. - Observe - Call for any changes  Melanocytic Nevi - Tan-brown and/or pink-flesh-colored symmetric macules and papules - Benign appearing on exam today - Observation - Call clinic for new or changing moles - Recommend daily use of broad spectrum spf 30+ sunscreen to sun-exposed areas.   Hemangiomas - Red papules - Discussed benign nature - Observe - Call for any changes  Actinic Damage - diffuse scaly erythematous macules with underlying dyspigmentation - Recommend daily broad spectrum sunscreen SPF 30+ to sun-exposed areas, reapply every 2 hours as needed.  - Call for new or changing lesions.  Skin cancer screening performed today.   Return in about 3 months (around 03/30/2020) for AK Follow up in 3 months, then 1 year TBSE.  I, Donzetta Kohut, CMA, am  acting as scribe for Forest Gleason, MD .  Documentation: I have reviewed the above documentation for accuracy and completeness, and I agree with the above.  Forest Gleason, MD

## 2019-12-30 NOTE — Patient Instructions (Addendum)
Recommend daily broad spectrum sunscreen SPF 30+ to sun-exposed areas, reapply every 2 hours as needed. Call for new or changing lesions.  Prior to procedure, discussed risks of blister formation, small wound, skin dyspigmentation, or rare scar following cryotherapy.  Liquid nitrogen was applied for 10-12 seconds to the skin lesion and the expected blistering or scabbing reaction explained. Do not pick at the area. Patient reminded to expect hypopigmented scars from the procedure. Return if lesion fails to fully resolve.  Cryotherapy Aftercare  . Wash gently with soap and water everyday.   Marland Kitchen Apply Vaseline and Band-Aid daily until healed.  Instructions for [facility] Medications  One or more of your medications was sent to the [facility] mail order compounding pharmacy. You will receive an email from them and can purchase the medicine through that link. It will then be mailed to your home at the address you confirmed. If for any reason you do not receive an email from them, please check your spam folder. If you still do not find the email, please let us know or call [facility] at [phone]

## 2020-01-23 ENCOUNTER — Encounter: Payer: Self-pay | Admitting: Dermatology

## 2020-02-15 DIAGNOSIS — I255 Ischemic cardiomyopathy: Secondary | ICD-10-CM | POA: Insufficient documentation

## 2020-03-29 ENCOUNTER — Ambulatory Visit: Payer: Medicare HMO | Admitting: Dermatology

## 2020-04-25 ENCOUNTER — Other Ambulatory Visit: Payer: Self-pay

## 2020-04-25 ENCOUNTER — Ambulatory Visit: Payer: Medicare HMO | Admitting: Dermatology

## 2020-04-25 ENCOUNTER — Encounter: Payer: Self-pay | Admitting: Dermatology

## 2020-04-25 DIAGNOSIS — L719 Rosacea, unspecified: Secondary | ICD-10-CM

## 2020-04-25 MED ORDER — DOXYCYCLINE HYCLATE 20 MG PO TABS: ORAL_TABLET | ORAL | 1 refills | Status: DC

## 2020-04-25 NOTE — Patient Instructions (Addendum)
Doxycycline should be taken with food to prevent nausea. Do not lay down for 30 minutes after taking. Be cautious with sun exposure and use good sun protection while on this medication. Pregnant women should not take this medication.   Mix 1 bottle of Afrin original in 1 bottle of Cerave PM moisturizer and mix well. Apply to the face one hour before leaving the house to reduce redness.    Gentle Skin Care Guide  1. Bathe no more than once a day.  2. Avoid bathing in hot water  3. Use a mild soap like Dove, Vanicream, Cetaphil, CeraVe. Can use Lever 2000 or Cetaphil antibacterial soap  4. Use soap only where you need it. On most days, use it under your arms, between your legs, and on your feet. Let the water rinse other areas unless visibly dirty.  5. When you get out of the bath/shower, use a towel to gently blot your skin dry, don't rub it.  6. While your skin is still a little damp, apply a moisturizing cream such as Vanicream, CeraVe, Cetaphil, Eucerin, Sarna lotion or plain Vaseline Jelly. For hands apply Neutrogena [place] Hand Cream or Excipial Hand Cream.  7. Reapply moisturizer any time you start to itch or feel dry.  8. Sometimes using free and clear laundry detergents can be helpful. Fabric softener sheets should be avoided. Downy Free & Gentle liquid, or any liquid fabric softener that is free of dyes and perfumes, it acceptable to use  9. If your doctor has given you prescription creams you may apply moisturizers over them

## 2020-04-25 NOTE — Progress Notes (Signed)
   Follow-Up Visit   Subjective  Chelsea Fitzgerald is a 75 y.o. female who presents for the following: Rosacea (3 months f/u on Rosacea on face, treating with with [facility] triple cream with fair response, pt would like to discuss to other treatment options).   The following portions of the chart were reviewed this encounter and updated as appropriate:   Tobacco  Allergies  Meds  Problems  Med Hx  Surg Hx  Fam Hx      Review of Systems:  No other skin or systemic complaints except as noted in HPI or Assessment and Plan.  Objective  Well appearing patient in no apparent distress; mood and affect are within normal limits.  A focused examination was performed including face,arms,hands . Relevant physical exam findings are noted in the Assessment and Plan.  Objective  face: Mid face erythema with telangiectasias + few inflammatory papules   Assessment & Plan  Rosacea face  Chronic condition with duration over one year. Condition is bothersome to patient. Some improvement with metronidazole/azelaic acid/ivermectin cream but still bothersome. Not currently at goal.  She does have dry eye but no gritty sensation  Rosacea is a chronic progressive skin condition usually affecting the face of adults, causing redness and/or acne bumps. It is treatable but not curable. It sometimes affects the eyes (ocular rosacea) as well. It may respond to topical and/or systemic medication and can flare with stress, sun exposure, alcohol, exercise and some foods.  Daily application of broad spectrum spf 30+ sunscreen to face is recommended to reduce flares.   Start Doxycycline 20 mg take 1 tablet po twice a day with food   Doxycycline should be taken with food to prevent nausea. Do not lay down for 30 minutes after taking. Be cautious with sun exposure and use good sun protection while on this medication. Pregnant women should not take this medication.    Can continue  metronidazole/ivermectin/azelaic acid cream if desired  Can start 1 bottle of afrin original mixed in 1 bottle of Cerave PM moisturizer, apply one hour before leaving house in the morning to help with redness  May consider cosmetic BBL in future  Ordered Medications: doxycycline (PERIOSTAT) 20 MG tablet  Return in about 1 month (around 05/26/2020) for Rosacea .  I, Marye Round, CMA, am acting as scribe for Forest Gleason, MD .  Documentation: I have reviewed the above documentation for accuracy and completeness, and I agree with the above.  Forest Gleason, MD

## 2020-05-05 DIAGNOSIS — E1142 Type 2 diabetes mellitus with diabetic polyneuropathy: Secondary | ICD-10-CM | POA: Insufficient documentation

## 2020-05-31 ENCOUNTER — Other Ambulatory Visit: Payer: Self-pay

## 2020-05-31 ENCOUNTER — Encounter: Payer: Self-pay | Admitting: Dermatology

## 2020-05-31 ENCOUNTER — Ambulatory Visit: Payer: Medicare HMO | Admitting: Dermatology

## 2020-05-31 DIAGNOSIS — L659 Nonscarring hair loss, unspecified: Secondary | ICD-10-CM

## 2020-05-31 DIAGNOSIS — L719 Rosacea, unspecified: Secondary | ICD-10-CM

## 2020-05-31 MED ORDER — BIMATOPROST 0.03 % EX SOLN: 1.0000 "application " | Freq: Every day | CUTANEOUS | 12 refills | Status: DC

## 2020-05-31 NOTE — Patient Instructions (Addendum)
Recommend CeraVe AM, EltaMD, LaRoche Posay  Recommend 45spf or higher with sun exposure.  Recommend taking Heliocare sun protection supplement daily in sunny weather for additional sun protection. For maximum protection on the sunniest days, you can take up to 2 capsules of regular Heliocare OR take 1 capsule of Heliocare Ultra. For prolonged exposure (such as a full day in the sun), you can repeat your dose of the supplement 4 hours after your first dose. Heliocare can be purchased at [facility] or at [email or web address].    Recommend Serica moisturizing scar formula cream every night or [facility] brand or Mederma silicone scar sheet every night for the first year after a scar appears to help with scar remodeling if desired. Scars remodel on their own for a full year.  For temporary redness reduction Mix 1 bottle of Afrin original in 1 bottle of Cerave PM, shake well, and apply 1 hour before you want redness to be improved in the morning

## 2020-05-31 NOTE — Progress Notes (Signed)
   Follow-Up Visit   Subjective  Chelsea Fitzgerald is a 75 y.o. female who presents for the following: Follow-up (Patient here today for 5 week rosacea follow up. She was taking doxycycline 20mg  twice daily for 1 month and using CeraVe PM mixed with Afrin. Patient feels rosacea has stayed about the same. ).  The following portions of the chart were reviewed this encounter and updated as appropriate:   Tobacco  Allergies  Meds  Problems  Med Hx  Surg Hx  Fam Hx      Review of Systems:  No other skin or systemic complaints except as noted in HPI or Assessment and Plan.  Objective  Well appearing patient in no apparent distress; mood and affect are within normal limits.  A focused examination was performed including face, lower legs. Relevant physical exam findings are noted in the Assessment and Plan.  Objective  face: Mid face erythema   Assessment & Plan  Rosacea face  Chronic condition, no cure. Has failed doxycycline  Rosacea is a chronic progressive skin condition usually affecting the face of adults, causing redness and/or acne bumps. It is treatable but not curable. It sometimes affects the eyes (ocular rosacea) as well. It may respond to topical and/or systemic medication and can flare with stress, sun exposure, alcohol, exercise and some foods.  Daily application of broad spectrum spf 30+ sunscreen to face is recommended to reduce flares.  Erythematotelangiectatic type  Discussed with patient hard to treat, best treatment would be BBL light treatment  D/c doxycycline since not helping. Could try prescription topical metronidazole but discussed if no change with doxycycline unlikely to get benefit from topicals.  May continue if desired For temporary redness reduction Mix 1 bottle of Afrin original in 1 bottle of Cerave PM, shake well, and apply 1 hour before you want redness to be improved in the morning   Other Related Medications doxycycline (PERIOSTAT) 20 MG  tablet  Return for TBSE as scheduled.  Graciella Belton, RMA, am acting as scribe for Forest Gleason, MD .  Documentation: I have reviewed the above documentation for accuracy and completeness, and I agree with the above.  Forest Gleason, MD

## 2020-06-12 ENCOUNTER — Encounter: Payer: Self-pay | Admitting: Dermatology

## 2020-10-30 HISTORY — PX: SHOULDER SURGERY: SHX246

## 2020-11-06 ENCOUNTER — Other Ambulatory Visit: Payer: Self-pay | Admitting: Family Medicine

## 2020-11-06 DIAGNOSIS — Z1231 Encounter for screening mammogram for malignant neoplasm of breast: Secondary | ICD-10-CM

## 2020-11-15 ENCOUNTER — Ambulatory Visit
Admission: RE | Admit: 2020-11-15 | Discharge: 2020-11-15 | Disposition: A | Discharge status: Home / Self Care | Payer: Medicare HMO | Source: Ambulatory Visit | Attending: Family Medicine | Admitting: Family Medicine

## 2020-11-15 ENCOUNTER — Other Ambulatory Visit: Payer: Self-pay

## 2020-11-15 DIAGNOSIS — Z1231 Encounter for screening mammogram for malignant neoplasm of breast: Secondary | ICD-10-CM | POA: Diagnosis present

## 2021-01-04 ENCOUNTER — Other Ambulatory Visit: Payer: Self-pay

## 2021-01-04 ENCOUNTER — Ambulatory Visit: Payer: Medicare HMO | Admitting: Dermatology

## 2021-01-04 DIAGNOSIS — L814 Other melanin hyperpigmentation: Secondary | ICD-10-CM

## 2021-01-04 DIAGNOSIS — L719 Rosacea, unspecified: Secondary | ICD-10-CM

## 2021-01-04 DIAGNOSIS — D239 Other benign neoplasm of skin, unspecified: Secondary | ICD-10-CM

## 2021-01-04 DIAGNOSIS — L738 Other specified follicular disorders: Secondary | ICD-10-CM

## 2021-01-04 DIAGNOSIS — L570 Actinic keratosis: Secondary | ICD-10-CM | POA: Diagnosis not present

## 2021-01-04 DIAGNOSIS — D229 Melanocytic nevi, unspecified: Secondary | ICD-10-CM

## 2021-01-04 DIAGNOSIS — L578 Other skin changes due to chronic exposure to nonionizing radiation: Secondary | ICD-10-CM

## 2021-01-04 DIAGNOSIS — Z808 Family history of malignant neoplasm of other organs or systems: Secondary | ICD-10-CM

## 2021-01-04 DIAGNOSIS — Z1283 Encounter for screening for malignant neoplasm of skin: Secondary | ICD-10-CM | POA: Diagnosis not present

## 2021-01-04 DIAGNOSIS — L821 Other seborrheic keratosis: Secondary | ICD-10-CM

## 2021-01-04 DIAGNOSIS — D2371 Other benign neoplasm of skin of right lower limb, including hip: Secondary | ICD-10-CM

## 2021-01-04 DIAGNOSIS — D1800 Hemangioma unspecified site: Secondary | ICD-10-CM

## 2021-01-04 NOTE — Progress Notes (Signed)
Follow-Up Visit   Subjective  Chelsea Fitzgerald is a 75 y.o. female who presents for the following: Annual Exam (Fhx of MM in daughter.). The patient presents for Total-Body Skin Exam (TBSE) for skin cancer screening and mole check. Patient has noticed lesions on her forehead that she would like checked today and an irritated mole on her R axilla.  The following portions of the chart were reviewed this encounter and updated as appropriate:   Tobacco  Allergies  Meds  Problems  Med Hx  Surg Hx  Fam Hx      Review of Systems:  No other skin or systemic complaints except as noted in HPI or Assessment and Plan.  Objective  Well appearing patient in no apparent distress; mood and affect are within normal limits.  A full examination was performed including scalp, head, eyes, ears, nose, lips, neck, chest, axillae, abdomen, back, buttocks, bilateral upper extremities, bilateral lower extremities, hands, feet, fingers, toes, fingernails, and toenails. All findings within normal limits unless otherwise noted below.    Assessment & Plan  AK (actinic keratosis) (3) L upper back x 1, R upper back x 1, R post calf x 1  Prior to procedure, discussed risks of blister formation, small wound, skin dyspigmentation, or rare scar following cryotherapy. Recommend Vaseline ointment to treated areas while healing.   Destruction of lesion - L upper back x 1, R upper back x 1, R post calf x 1 Complexity: simple   Destruction method: cryotherapy   Informed consent: discussed and consent obtained   Timeout:  patient name, date of birth, surgical site, and procedure verified Lesion destroyed using liquid nitrogen: Yes   Region frozen until ice ball extended beyond lesion: Yes   Outcome: patient tolerated procedure well with no complications   Post-procedure details: wound care instructions given    Rosacea Face  Chronic condition with duration or expected duration over one year. Currently  well-controlled.  Cont doxycycline 20 mg twice a day  Rosacea is a chronic progressive skin condition usually affecting the face of adults, causing redness and/or acne bumps. It is treatable but not curable. It sometimes affects the eyes (ocular rosacea) as well. It may respond to topical and/or systemic medication and can flare with stress, sun exposure, alcohol, exercise and some foods.  Daily application of broad spectrum spf 30+ sunscreen to face is recommended to reduce flares.   Related Medications doxycycline (PERIOSTAT) 20 MG tablet Take 1 tablet twice a day with food  Dermatofibroma R leg  Benign growth possibly related to trauma, such as an insect bite.  Benign-appearing. Call for changes.  Lentigines - Scattered tan macules - Due to sun exposure - Benign-appearing, observe - Recommend daily broad spectrum sunscreen SPF 30+ to sun-exposed areas, reapply every 2 hours as needed. - Call for any changes  Seborrheic Keratoses - Stuck-on, waxy, tan-brown papules and/or plaques  - Benign-appearing - Discussed benign etiology and prognosis. - Observe - Call for any changes  Melanocytic Nevi - Tan-brown and/or pink-flesh-colored symmetric macules and papules - Benign appearing on exam today - Observation - Call clinic for new or changing moles - Recommend daily use of broad spectrum spf 30+ sunscreen to sun-exposed areas.   Hemangiomas - Red papules - Discussed benign nature - Observe - Call for any changes  Actinic Damage - Severe, confluent actinic changes with pre-cancerous actinic keratoses  - Severe, chronic, not at goal, secondary to cumulative UV radiation exposure over time - diffuse scaly erythematous  macules and papules with underlying dyspigmentation - Discussed Prescription "Field Treatment" for Severe, Chronic Confluent Actinic Changes with Pre-Cancerous Actinic Keratoses Field treatment involves treatment of an entire area of skin that has confluent  Actinic Changes (Sun/ Ultraviolet light damage) and PreCancerous Actinic Keratoses by method of PhotoDynamic Therapy (PDT) and/or prescription Topical Chemotherapy agents such as 5-fluorouracil, 5-fluorouracil/calcipotriene, and/or imiquimod.  The purpose is to decrease the number of clinically evident and subclinical PreCancerous lesions to prevent progression to development of skin cancer by chemically destroying early precancer changes that may or may not be visible.  It has been shown to reduce the risk of developing skin cancer in the treated area. As a result of treatment, redness, scaling, crusting, and open sores may occur during treatment course. One or more than one of these methods may be used and may have to be used several times to control, suppress and eliminate the PreCancerous changes. Discussed treatment course, expected reaction, and possible side effects. - Recommend daily broad spectrum sunscreen SPF 30+ to sun-exposed areas, reapply every 2 hours as needed.  - Staying in the shade or wearing long sleeves, sun glasses (UVA+UVB protection) and wide brim hats (4-inch brim around the entire circumference of the hat) are also recommended. - Call for new or changing lesions. - Start 5FU/Calcipotriene mix apply to aa BID x 4 days to the nose (R nasal sidewall) and BID x 7 days to the hands and chest. OK to wait until after the holidays to treat.   Sebaceous Hyperplasia - Small yellow papules with a central dell - Benign - Observe  Skin cancer screening performed today.  Return in about 1 year (around 01/04/2022) for TBSE.  Luther Redo, CMA, am acting as scribe for Forest Gleason, MD .  Documentation: I have reviewed the above documentation for accuracy and completeness, and I agree with the above.  Forest Gleason, MD

## 2021-01-04 NOTE — Patient Instructions (Signed)
Recommend taking Heliocare sun protection supplement daily in sunny weather for additional sun protection. For maximum protection on the sunniest days, you can take up to 2 capsules of regular Heliocare OR take 1 capsule of Heliocare Ultra. For prolonged exposure (such as a full day in the sun), you can repeat your dose of the supplement 4 hours after your first dose. Heliocare can be purchased at [facility] or at [email or web address].    If you have any questions or concerns for your doctor, please call our main line at [phone] and press option 4 to reach your doctor's medical assistant. If no one answers, please leave a voicemail as directed and we will return your call as soon as possible. Messages left after 4 pm will be answered the following business day.   You may also send [place] a message via [place]. We typically respond to MyChart messages within 1-2 business days.  For prescription refills, please ask your pharmacy to contact our office. Our fax number is [phone].  If you have an urgent issue when the clinic is closed that cannot wait until the next business day, you can page your doctor at the number below.    Please note that while we do our best to be available for urgent issues outside of office hours, we are not available 24/7.   If you have an urgent issue and are unable to reach [place], you may choose to seek medical care at your doctor's office, retail clinic, urgent care center, or emergency room.  If you have a medical emergency, please immediately call 911 or go to the emergency department.  Pager Numbers  - Dr. Nehemiah Massed: [phone]  - Dr. Laurence Ferrari: [phone]  - Dr. Nicole Kindred: [phone]  In the event of inclement weather, please call our main line at [phone] for an update on the status of any delays or closures.  Dermatology Medication Tips: Please keep the boxes that topical medications come in in order to help keep track of the instructions about  where and how to use these. Pharmacies typically print the medication instructions only on the boxes and not directly on the medication tubes.   If your medication is too expensive, please contact our office at [phone] option 4 or send [place] a message through [place].   We are unable to tell what your co-pay for medications will be in advance as this is different depending on your insurance coverage. However, we may be able to find a substitute medication at lower cost or fill out paperwork to get insurance to cover a needed medication.   If a prior authorization is required to get your medication covered by your insurance company, please allow [place] 1-2 business days to complete this process.  Drug prices often vary depending on where the prescription is filled and some pharmacies may offer cheaper prices.  The website www.goodrx.com contains coupons for medications through different pharmacies. The prices here do not account for what the cost may be with help from insurance (it may be cheaper with your insurance), but the website can give you the price if you did not use any insurance.  - You can print the associated coupon and take it with your prescription to the pharmacy.  - You may also stop by our office during regular business hours and pick up a GoodRx coupon card.  - If you need your prescription sent electronically to a different pharmacy, notify our office through [facility] or by phone at [phone].  -  Start 5-fluorouracil/calcipotriene cream twice a day for 4 days to affected areas including the nose and BID x 7 days to the hands and chest. Prescription sent to [facility]. Patient advised they will receive an email to purchase the medication online and have it sent to their home. Patient provided with handout reviewing treatment course and side effects and advised to call or message [place] on MyChart with any concerns.

## 2021-01-17 ENCOUNTER — Encounter: Payer: Self-pay | Admitting: Dermatology

## 2021-05-03 ENCOUNTER — Encounter: Payer: Self-pay | Admitting: Gastroenterology

## 2021-05-03 ENCOUNTER — Ambulatory Visit (INDEPENDENT_AMBULATORY_CARE_PROVIDER_SITE_OTHER): Payer: Medicare HMO | Admitting: Gastroenterology

## 2021-05-03 ENCOUNTER — Other Ambulatory Visit: Payer: Self-pay

## 2021-05-03 VITALS — BP 160/88 | HR 69 | Temp 98.8°F | Ht 66.0 in | Wt 157.4 lb

## 2021-05-03 DIAGNOSIS — K5730 Diverticulosis of large intestine without perforation or abscess without bleeding: Secondary | ICD-10-CM | POA: Diagnosis not present

## 2021-05-03 DIAGNOSIS — R1032 Left lower quadrant pain: Secondary | ICD-10-CM

## 2021-05-03 NOTE — Progress Notes (Signed)
Cephas Darby, MD [address]  [place]  [place], [place] [address]  Main: [phone]  Fax: [phone]    Gastroenterology Consultation  Referring Provider:     Derinda Late, MD Primary Care Physician:  Derinda Late, MD Primary Gastroenterologist:  Dr. Cephas Darby Reason for Consultation:     Lower abdominal pain        HPI:   Chelsea Fitzgerald is a 76 y.o. female referred by Dr. Derinda Late, MD  for consultation & management of lower abdominal pain.  Patient went to see her urogynecologist at [facility] in mid December.  She underwent CT abdomen and pelvis with contrast which revealed extensive sigmoid diverticulosis with no other intra-abdominal pathology.  Patient reports that she also suffered from 1 week of diarrhea associated with left lower quadrant discomfort.  She started taking more fiber, fiber pills 4 times a day which helped to keep her bowels regular.  She is no longer experiencing diarrhea.  She is to experience constipation prior to episodes of diarrhea.  She denies any rectal bleeding.  She is no longer experiencing abdominal pain.  She does have history of cystocele, pessary placement resulted in incontinence.  Patient underwent labs yesterday which revealed normal hemoglobin, WBC count, CMP, hemoglobin A1c 6.2.  Patient reports that she has been watching her diet carefully.  And trying to exercise.  NSAIDs: None  Antiplts/Anticoagulants/Anti thrombotics: None  GI Procedures: Reports undergoing colonoscopy by Dr. Vira Agar several years ago, report not available  Past Medical History:  Diagnosis Date   Anxiety    Arthritis    Carotid stenosis, bilateral    Coronary artery disease    Depression    Hyperlipidemia    Hypertension    LBBB (left bundle branch block) 12/28/2018   Noted on EKG   Migraines    Neuropathy    STEMI (ST elevation myocardial infarction) ([place])    SUI (stress urinary incontinence, female)    Uterine prolapse     Varicose veins     Past Surgical History:  Procedure Laterality Date   BREAST BIOPSY Left 03/20/2016   FRAGMENT OF CYST WALL.   BREAST BIOPSY Right 05/29/2017   ribbon clip, benign   CAROTID STENT     CHOLECYSTECTOMY     COLONOSCOPY     CORONARY ANGIOPLASTY     PARTIAL KNEE ARTHROPLASTY Left 01/18/2019   Procedure: Left knee medial unicompartmental arthroplasty;  Surgeon: Gaynelle Arabian, MD;  Location: [facility];  Service: Orthopedics;  Laterality: Left;  [id number]   UMBILICAL HERNIA REPAIR     Current Outpatient Medications:    ALPRAZolam (XANAX) 0.5 MG tablet, Take 0.25 mg by mouth at bedtime. , Disp: , Rfl:    bimatoprost (LATISSE) 0.03 % ophthalmic solution, Place 1 application into both eyes at bedtime. Place one drop on applicator and apply evenly along the skin of the upper eyelid at base of eyelashes once daily at bedtime; repeat procedure for second eye (use a clean applicator)., Disp: 3 mL, Rfl: 12   citalopram (CELEXA) 20 MG tablet, Take 20 mg by mouth daily. , Disp: , Rfl: 3   conjugated estrogens (PREMARIN) vaginal cream, Place 1 Applicatorful vaginally See admin instructions. Every 14 days, Disp: , Rfl:    doxycycline (PERIOSTAT) 20 MG tablet, Take 1 tablet twice a day with food, Disp: 60 tablet, Rfl: 1   fluticasone (FLONASE) 50 MCG/ACT nasal spray, Place 2 sprays into both nostrils daily. , Disp: , Rfl:  gabapentin (NEURONTIN) 100 MG capsule, Take 2 capsules (200 mg total) by mouth 3 (three) times daily. Take two 100 mg capsules three times a day for two weeks following surgery.Then take two 100 mg capsules two times a day for two weeks. Then take two 100 mg capsules once a day for two weeks. Then discontinue the Gabapentin., Disp: 168 capsule, Rfl: 0   loratadine (CLARITIN) 10 MG tablet, Take 10 mg by mouth daily as needed for allergies. , Disp: , Rfl:    losartan-hydrochlorothiazide (HYZAAR) 50-12.5 MG tablet, Take 1 tablet by mouth daily. , Disp: , Rfl:    methocarbamol  (ROBAXIN) 500 MG tablet, Take 1 tablet (500 mg total) by mouth every 6 (six) hours as needed for muscle spasms., Disp: 40 tablet, Rfl: 0   metoprolol succinate (TOPROL-XL) 50 MG 24 hr tablet, Take 50 mg by mouth daily. , Disp: , Rfl:    montelukast (SINGULAIR) 10 MG tablet, Take 10 mg by mouth at bedtime., Disp: , Rfl:    oxyCODONE (OXY IR/ROXICODONE) 5 MG immediate release tablet, Take 1-2 tablets (5-10 mg total) by mouth every 6 (six) hours as needed for moderate pain (pain score 4-6)., Disp: 56 tablet, Rfl: 0   rosuvastatin (CRESTOR) 5 MG tablet, Take 5 mg by mouth daily., Disp: , Rfl:    trimethoprim (TRIMPEX) 100 MG tablet, Take 100 mg by mouth daily., Disp: , Rfl:    rivaroxaban (XARELTO) 10 MG TABS tablet, Take 1 tablet (10 mg total) by mouth daily with breakfast for 20 days. Take one tablet Xarelto once a day for three weeks following surgery. Then change to one baby Aspirin (81 mg) once a day for three weeks. Then discontinue Aspirin., Disp: 20 tablet, Rfl: 0    Family History  Problem Relation Age of Onset   Breast cancer Sister 9   Cancer Father    Hypertension Father    Breast cancer Daughter 27     Social History   Tobacco Use   Smoking status: Former    Packs/day: 1.00    Types: Cigarettes    Quit date: 01/22/2012    Years since quitting: 9.2   Smokeless tobacco: Never   Tobacco comments:    49   Vaping Use   Vaping Use: Never used  Substance Use Topics   Alcohol use: Yes    Comment: occas   Drug use: Never    Allergies as of 05/03/2021 - Review Complete 05/03/2021  Allergen Reaction Noted   Corticosteroids Rash 11/21/2020   Ace inhibitors Other (See Comments) 11/03/2013   Lipitor [atorvastatin] Diarrhea 12/20/2015   Lisinopril Itching 12/20/2015   Simvastatin Diarrhea 12/20/2015   Statins Diarrhea 12/20/2015    Review of Systems:    All systems reviewed and negative except where noted in HPI.   Physical Exam:  BP (!) 160/88 (BP Location: Left Arm,  Patient Position: Sitting, Cuff Size: Normal)    Pulse 69    Temp 98.8 F (37.1 C) (Oral)    Ht 5\' 6"  (1.676 m)    Wt 157 lb 6.4 oz (71.4 kg)    BMI 25.41 kg/m  No LMP recorded. Patient is postmenopausal.  General:   Alert,  Well-developed, well-nourished, pleasant and cooperative in NAD Head:  Normocephalic and atraumatic. Eyes:  Sclera clear, no icterus.   Conjunctiva pink. Ears:  Normal auditory acuity. Nose:  No deformity, discharge, or lesions. Mouth:  No deformity or lesions,oropharynx pink & moist. Neck:  Supple; no masses or thyromegaly. Lungs:  Respirations even and unlabored.  Clear throughout to auscultation.   No wheezes, crackles, or rhonchi. No acute distress. Heart:  Regular rate and rhythm; no murmurs, clicks, rubs, or gallops. Abdomen:  Normal bowel sounds. Soft, non-tender and non-distended without masses, hepatosplenomegaly or hernias noted.  No guarding or rebound tenderness.   Rectal: Not performed Msk:  Symmetrical without gross deformities. Good, equal movement & strength bilaterally. Pulses:  Normal pulses noted. Extremities:  No clubbing or edema.  No cyanosis. Neurologic:  Alert and oriented x3;  grossly normal neurologically. Skin:  Intact without significant lesions or rashes. No jaundice. Psych:  Alert and cooperative. Normal mood and affect.  Imaging Studies: Reviewed  Assessment and Plan:   DAANYA LANPHIER is a 76 y.o. female with metabolic syndrome, carotid artery stenosis, coronary artery disease is seen in consultation for an episode of left lower quadrant pain associated with 1 week history of nonbloody diarrhea.  CT revealed severe sigmoid diverticulosis.  It is possible that she may had an episode of acute sigmoid diverticulitis which was mild and self-limited.  Or she may had infectious diarrhea which resolved.  Educated patient to keep bowels regular with adequate amount of fiber and water.  Avoid constipation.  Continue healthy diet and  exercise  Will obtain copy of colonoscopy report from [facility] clinic GI.  Dr. Vira Agar has retired to determine the timing of her next colonoscopy if needed  I do not recommend further work-up at this time   Follow up as needed   Cephas Darby, MD

## 2021-05-10 ENCOUNTER — Other Ambulatory Visit: Payer: Self-pay

## 2021-05-15 ENCOUNTER — Telehealth: Payer: Self-pay

## 2021-05-15 NOTE — Telephone Encounter (Signed)
Recommend colonoscopy Dx: lower Abdominal pain  RV

## 2021-05-15 NOTE — Telephone Encounter (Signed)
Patient called to let us know her last colonoscopy was June 2012

## 2021-05-16 ENCOUNTER — Other Ambulatory Visit: Payer: Self-pay

## 2021-05-16 DIAGNOSIS — R109 Unspecified abdominal pain: Secondary | ICD-10-CM

## 2021-05-16 MED ORDER — NA SULFATE-K SULFATE-MG SULF 17.5-3.13-1.6 GM/177ML PO SOLN: 1.0000 | Freq: Once | ORAL | 0 refills | Status: AC

## 2021-05-16 MED ORDER — CLENPIQ 10-3.5-12 MG-GM -GM/160ML PO SOLN: 320.0000 mL | ORAL | 0 refills | Status: DC

## 2021-05-16 NOTE — Progress Notes (Signed)
Gastroenterology Pre-Procedure Review  Request Date: 06/05/2021 Requesting Physician: Dr. Marius Ditch  PATIENT REVIEW QUESTIONS: The patient responded to the following health history questions as indicated:    1. Are you having any GI issues? no 2. Do you have a personal history of Polyps? yes (LAST COLONOSCOPY) 3. Do you have a family history of Colon Cancer or Polyps? no 4. Diabetes Mellitus? yes (TYPE 2) 5. Joint replacements in the past 12 months?no 6. Major health problems in the past 3 months?no 7. Any artificial heart valves, MVP, or defibrillator?yes (HAS A STINT)    MEDICATIONS & ALLERGIES:    Patient reports the following regarding taking any anticoagulation/antiplatelet therapy:   Plavix, Coumadin, Eliquis, Xarelto, Lovenox, Pradaxa, Brilinta, or Effient? no Aspirin? no  Patient confirms/reports the following medications:  Current Outpatient Medications  Medication Sig Dispense Refill   ALPRAZolam (XANAX) 0.5 MG tablet Take 0.25 mg by mouth at bedtime.      bimatoprost (LATISSE) 0.03 % ophthalmic solution Place 1 application into both eyes at bedtime. Place one drop on applicator and apply evenly along the skin of the upper eyelid at base of eyelashes once daily at bedtime; repeat procedure for second eye (use a clean applicator). 3 mL 12   citalopram (CELEXA) 20 MG tablet Take 20 mg by mouth daily.   3   conjugated estrogens (PREMARIN) vaginal cream Place 1 Applicatorful vaginally See admin instructions. Every 14 days     doxycycline (PERIOSTAT) 20 MG tablet Take 1 tablet twice a day with food 60 tablet 1   fluticasone (FLONASE) 50 MCG/ACT nasal spray Place 2 sprays into both nostrils daily.      gabapentin (NEURONTIN) 100 MG capsule Take 2 capsules (200 mg total) by mouth 3 (three) times daily. Take two 100 mg capsules three times a day for two weeks following surgery.Then take two 100 mg capsules two times a day for two weeks. Then take two 100 mg capsules once a day for two  weeks. Then discontinue the Gabapentin. 168 capsule 0   loratadine (CLARITIN) 10 MG tablet Take 10 mg by mouth daily as needed for allergies.      losartan-hydrochlorothiazide (HYZAAR) 50-12.5 MG tablet Take 1 tablet by mouth daily.      methocarbamol (ROBAXIN) 500 MG tablet Take 1 tablet (500 mg total) by mouth every 6 (six) hours as needed for muscle spasms. 40 tablet 0   metoprolol succinate (TOPROL-XL) 50 MG 24 hr tablet Take 50 mg by mouth daily.      montelukast (SINGULAIR) 10 MG tablet Take 10 mg by mouth at bedtime.     oxyCODONE (OXY IR/ROXICODONE) 5 MG immediate release tablet Take 1-2 tablets (5-10 mg total) by mouth every 6 (six) hours as needed for moderate pain (pain score 4-6). 56 tablet 0   rosuvastatin (CRESTOR) 5 MG tablet Take 5 mg by mouth daily.     trimethoprim (TRIMPEX) 100 MG tablet Take 100 mg by mouth daily.     No current facility-administered medications for this visit.    Patient confirms/reports the following allergies:  Allergies  Allergen Reactions   Corticosteroids Rash   Ace Inhibitors Other (See Comments)    Mouth ulcers   Lipitor [Atorvastatin] Diarrhea   Lisinopril Itching   Simvastatin Diarrhea   Statins Diarrhea    No orders of the defined types were placed in this encounter.   AUTHORIZATION INFORMATION Primary Insurance: 1D#: Group #:  Secondary Insurance: 1D#: Group #:  SCHEDULE INFORMATION: Date: 06/05/2021 Time: Location:[facility]

## 2021-06-04 ENCOUNTER — Encounter: Payer: Self-pay | Admitting: Gastroenterology

## 2021-06-05 ENCOUNTER — Ambulatory Visit: Payer: Medicare HMO | Admitting: Anesthesiology

## 2021-06-05 ENCOUNTER — Encounter: Payer: Self-pay | Admitting: Gastroenterology

## 2021-06-05 ENCOUNTER — Other Ambulatory Visit: Payer: Self-pay

## 2021-06-05 ENCOUNTER — Ambulatory Visit
Admission: RE | Admit: 2021-06-05 | Discharge: 2021-06-05 | Disposition: A | Discharge status: Home / Self Care | Payer: Medicare HMO | Attending: Gastroenterology | Admitting: Gastroenterology

## 2021-06-05 ENCOUNTER — Encounter
Admission: RE | Disposition: A | Discharge status: Home / Self Care | Payer: Self-pay | Source: Home / Self Care | Attending: Gastroenterology

## 2021-06-05 ENCOUNTER — Telehealth: Payer: Self-pay

## 2021-06-05 DIAGNOSIS — K5730 Diverticulosis of large intestine without perforation or abscess without bleeding: Secondary | ICD-10-CM | POA: Insufficient documentation

## 2021-06-05 DIAGNOSIS — I2510 Atherosclerotic heart disease of native coronary artery without angina pectoris: Secondary | ICD-10-CM | POA: Insufficient documentation

## 2021-06-05 DIAGNOSIS — Z955 Presence of coronary angioplasty implant and graft: Secondary | ICD-10-CM | POA: Insufficient documentation

## 2021-06-05 DIAGNOSIS — D122 Benign neoplasm of ascending colon: Secondary | ICD-10-CM | POA: Insufficient documentation

## 2021-06-05 DIAGNOSIS — G629 Polyneuropathy, unspecified: Secondary | ICD-10-CM | POA: Insufficient documentation

## 2021-06-05 DIAGNOSIS — Z86010 Personal history of colonic polyps: Secondary | ICD-10-CM

## 2021-06-05 DIAGNOSIS — E785 Hyperlipidemia, unspecified: Secondary | ICD-10-CM | POA: Diagnosis not present

## 2021-06-05 DIAGNOSIS — R109 Unspecified abdominal pain: Secondary | ICD-10-CM

## 2021-06-05 DIAGNOSIS — I10 Essential (primary) hypertension: Secondary | ICD-10-CM | POA: Diagnosis not present

## 2021-06-05 DIAGNOSIS — D124 Benign neoplasm of descending colon: Secondary | ICD-10-CM | POA: Diagnosis not present

## 2021-06-05 DIAGNOSIS — I447 Left bundle-branch block, unspecified: Secondary | ICD-10-CM | POA: Diagnosis not present

## 2021-06-05 DIAGNOSIS — R1030 Lower abdominal pain, unspecified: Secondary | ICD-10-CM | POA: Diagnosis present

## 2021-06-05 DIAGNOSIS — I252 Old myocardial infarction: Secondary | ICD-10-CM | POA: Insufficient documentation

## 2021-06-05 DIAGNOSIS — F419 Anxiety disorder, unspecified: Secondary | ICD-10-CM | POA: Insufficient documentation

## 2021-06-05 DIAGNOSIS — Z87891 Personal history of nicotine dependence: Secondary | ICD-10-CM | POA: Diagnosis not present

## 2021-06-05 DIAGNOSIS — I6523 Occlusion and stenosis of bilateral carotid arteries: Secondary | ICD-10-CM | POA: Insufficient documentation

## 2021-06-05 DIAGNOSIS — F32A Depression, unspecified: Secondary | ICD-10-CM | POA: Insufficient documentation

## 2021-06-05 DIAGNOSIS — K644 Residual hemorrhoidal skin tags: Secondary | ICD-10-CM | POA: Insufficient documentation

## 2021-06-05 HISTORY — PX: COLONOSCOPY WITH PROPOFOL: SHX5780

## 2021-06-05 SURGERY — COLONOSCOPY WITH PROPOFOL
Anesthesia: General

## 2021-06-05 MED ORDER — PROPOFOL 10 MG/ML IV BOLUS
INTRAVENOUS | Status: AC
  Filled 2021-06-05: qty 20

## 2021-06-05 MED ORDER — SODIUM CHLORIDE 0.9 % IV SOLN: INTRAVENOUS | Status: DC

## 2021-06-05 MED ORDER — PROPOFOL 10 MG/ML IV BOLUS
INTRAVENOUS | Status: DC | PRN
  Administered 2021-06-05: 70 mg via INTRAVENOUS

## 2021-06-05 MED ORDER — LIDOCAINE HCL (PF) 2 % IJ SOLN
INTRAMUSCULAR | Status: AC
  Filled 2021-06-05: qty 5

## 2021-06-05 MED ORDER — PROPOFOL 500 MG/50ML IV EMUL
INTRAVENOUS | Status: AC
  Filled 2021-06-05: qty 50

## 2021-06-05 MED ORDER — PROPOFOL 500 MG/50ML IV EMUL
INTRAVENOUS | Status: DC | PRN
  Administered 2021-06-05: 80 ug/kg/min via INTRAVENOUS

## 2021-06-05 MED ORDER — LIDOCAINE HCL (CARDIAC) PF 100 MG/5ML IV SOSY
PREFILLED_SYRINGE | INTRAVENOUS | Status: DC | PRN
  Administered 2021-06-05: 50 mg via INTRAVENOUS

## 2021-06-05 NOTE — H&P (Signed)
?Cephas Darby, MD ?[address]  ?[address]  ?[place], [place] [address]  ?Main: [phone]  ?Fax: [phone] ?Pager: [phone] ? ?Primary Care Physician:  Derinda Late, MD ?Primary Gastroenterologist:  Dr. Cephas Darby ? ?Pre-Procedure History & Physical: ?HPI:  Chelsea Fitzgerald is a 76 y.o. female is here for an colonoscopy. ?  ?Past Medical History:  ?Diagnosis Date  ? Anxiety   ? Arthritis   ? Carotid stenosis, bilateral   ? Coronary artery disease   ? Depression   ? Hyperlipidemia   ? Hypertension   ? LBBB (left bundle branch block) 12/28/2018  ? Noted on EKG  ? Migraines   ? Neuropathy   ? STEMI (ST elevation myocardial infarction) ([place])   ? SUI (stress urinary incontinence, female)   ? Uterine prolapse   ? Varicose veins   ? ? ?Past Surgical History:  ?Procedure Laterality Date  ? BREAST BIOPSY Left 03/20/2016  ? FRAGMENT OF CYST WALL.  ? BREAST BIOPSY Right 05/29/2017  ? ribbon clip, benign  ? CAROTID STENT    ? CHOLECYSTECTOMY    ? COLONOSCOPY    ? CORONARY ANGIOPLASTY    ? PARTIAL KNEE ARTHROPLASTY Left 01/18/2019  ? Procedure: Left knee medial unicompartmental arthroplasty;  Surgeon: Gaynelle Arabian, MD;  Location: [facility];  Service: Orthopedics;  Laterality: Left;  50mn  ? SHOULDER SURGERY Right 10/2020  ? UMBILICAL HERNIA REPAIR    ? ? ?Prior to Admission medications   ?Medication Sig Start Date End Date Taking? Authorizing Provider  ?ALPRAZolam (XANAX) 0.5 MG tablet Take 0.25 mg by mouth at bedtime.  03/17/14  Yes [provider]  ?bimatoprost (LATISSE) 0.03 % ophthalmic solution Place 1 application into both eyes at bedtime. Place one drop on applicator and apply evenly along the skin of the upper eyelid at base of eyelashes once daily at bedtime; repeat procedure for second eye (use a clean applicator). 05/31/20  Yes Moye, [place] MD  ?citalopram (CELEXA) 20 MG tablet Take 20 mg by mouth daily.  12/28/15  Yes [provider]  ?conjugated estrogens (PREMARIN) vaginal  cream Place 1 Applicatorful vaginally See admin instructions. Every 14 days 01/10/15  Yes [provider]  ?fluticasone (FLONASE) 50 MCG/ACT nasal spray Place 2 sprays into both nostrils daily.  01/25/14  Yes [provider]  ?gabapentin (NEURONTIN) 100 MG capsule Take 2 capsules (200 mg total) by mouth 3 (three) times daily. Take two 100 mg capsules three times a day for two weeks following surgery.Then take two 100 mg capsules two times a day for two weeks. Then take two 100 mg capsules once a day for two weeks. Then discontinue the Gabapentin. 01/19/19  Yes DIrving Copas PA-C  ?loratadine (CLARITIN) 10 MG tablet Take 10 mg by mouth daily as needed for allergies.    Yes [provider]  ?losartan-hydrochlorothiazide (HYZAAR) 50-12.5 MG tablet Take 1 tablet by mouth daily.  11/28/14  Yes [provider]  ?metoprolol succinate (TOPROL-XL) 50 MG 24 hr tablet Take 50 mg by mouth daily.  01/16/15  Yes [provider]  ?montelukast (SINGULAIR) 10 MG tablet Take 10 mg by mouth at bedtime.   Yes [provider]  ?rosuvastatin (CRESTOR) 5 MG tablet Take 5 mg by mouth daily.   Yes [provider]  ?doxycycline (PERIOSTAT) 20 MG tablet Take 1 tablet twice a day with food ?Patient not taking: Reported on 06/05/2021 04/25/20   MLaurence Ferrari [place] MD  ?methocarbamol (ROBAXIN) 500 MG tablet  Take 1 tablet (500 mg total) by mouth every 6 (six) hours as needed for muscle spasms. ?Patient not taking: Reported on 06/05/2021 01/19/19   Irving Copas, PA-C  ?oxyCODONE (OXY IR/ROXICODONE) 5 MG immediate release tablet Take 1-2 tablets (5-10 mg total) by mouth every 6 (six) hours as needed for moderate pain (pain score 4-6). ?Patient not taking: Reported on 06/05/2021 01/19/19   Irving Copas, PA-C  ?Sod Picosulfate-Mag Ox-Cit Acd (CLENPIQ) 10-3.5-12 MG-GM -GM/160ML SOLN Take 320 mLs by mouth as directed. 05/16/21   Lin Landsman, MD  ?trimethoprim (TRIMPEX) 100 MG  tablet Take 100 mg by mouth daily. ?Patient not taking: Reported on 06/05/2021 01/08/18   [provider]  ? ? ?Allergies as of 05/16/2021 - Review Complete 05/16/2021  ?Allergen Reaction Noted  ? Corticosteroids Rash 11/21/2020  ? Ace inhibitors Other (See Comments) 11/03/2013  ? Lipitor [atorvastatin] Diarrhea 12/20/2015  ? Lisinopril Itching 12/20/2015  ? Simvastatin Diarrhea 12/20/2015  ? Statins Diarrhea 12/20/2015  ? ? ?Family History  ?Problem Relation Age of Onset  ? Breast cancer Sister 12  ? Cancer Father   ? Hypertension Father   ? Breast cancer Daughter 39  ? ? ?Social History  ? ?Socioeconomic History  ? Marital status: Married  ?  Spouse name: Not on file  ? Number of children: Not on file  ? Years of education: Not on file  ? Highest education level: Not on file  ?Occupational History  ? Not on file  ?Tobacco Use  ? Smoking status: Former  ?  Packs/day: 1.00  ?  Types: Cigarettes  ?  Quit date: 01/22/2012  ?  Years since quitting: 9.3  ? Smokeless tobacco: Never  ? Tobacco comments:  ?  49   ?Vaping Use  ? Vaping Use: Never used  ?Substance and Sexual Activity  ? Alcohol use: Yes  ?  Comment: occas  ? Drug use: Never  ? Sexual activity: Not Currently  ?Other Topics Concern  ? Not on file  ?Social History Narrative  ? Not on file  ? ?Social Determinants of Health  ? ?Financial Resource Strain: Not on file  ?Food Insecurity: Not on file  ?Transportation Needs: Not on file  ?Physical Activity: Not on file  ?Stress: Not on file  ?Social Connections: Not on file  ?Intimate Partner Violence: Not on file  ? ? ?Review of Systems: ?See HPI, otherwise negative ROS ? ?Physical Exam: ?BP 128/88   Pulse 77   Temp (!) 97 ?F (36.1 ?C) (Temporal)   Resp 20   Ht '5\' 2"'$  (1.575 m)   Wt 68.5 kg   SpO2 97%   BMI 27.62 kg/m?  ?General:   Alert,  pleasant and cooperative in NAD ?Head:  Normocephalic and atraumatic. ?Neck:  Supple; no masses or thyromegaly. ?Lungs:  Clear throughout to auscultation.    ?Heart:   Regular rate and rhythm. ?Abdomen:  Soft, nontender and nondistended. Normal bowel sounds, without guarding, and without rebound.   ?Neurologic:  Alert and  oriented x4;  grossly normal neurologically. ? ?Impression/Plan: ?Chelsea Fitzgerald is here for an colonoscopy to be performed for lower abdominal pain ? ?Risks, benefits, limitations, and alternatives regarding  colonoscopy have been reviewed with the patient.  Questions have been answered.  All parties agreeable. ? ? ?Sherri Sear, MD  06/05/2021, 9:08 AM ?

## 2021-06-05 NOTE — Anesthesia Postprocedure Evaluation (Signed)
Anesthesia Post Note ? ?Patient: Chelsea Fitzgerald ? ?Procedure(s) Performed: COLONOSCOPY WITH PROPOFOL ? ?Patient location during evaluation: Endoscopy ?Anesthesia Type: General ?Level of consciousness: awake and alert ?Pain management: pain level controlled ?Vital Signs Assessment: post-procedure vital signs reviewed and stable ?Respiratory status: spontaneous breathing, nonlabored ventilation, respiratory function stable and patient connected to nasal cannula oxygen ?Cardiovascular status: blood pressure returned to baseline and stable ?Postop Assessment: no apparent nausea or vomiting ?Anesthetic complications: no ? ? ?No notable events documented. ? ? ?Last Vitals:  ?Vitals:  ? 06/05/21 1043 06/05/21 1053  ?BP: 108/63 (!) 145/88  ?Pulse: 66 64  ?Resp: 15 17  ?Temp:    ?SpO2: 90% 91%  ?  ?Last Pain:  ?Vitals:  ? 06/05/21 1053  ?TempSrc:   ?PainSc: 0-No pain  ? ? ?  ?  ?  ?  ?  ?  ? ?Martha Clan ? ? ? ? ?

## 2021-06-05 NOTE — Transfer of Care (Signed)
Immediate Anesthesia Transfer of Care Note ? ?Patient: Chelsea Fitzgerald ? ?Procedure(s) Performed: COLONOSCOPY WITH PROPOFOL ? ?Patient Location: PACU and Endoscopy Unit ? ?Anesthesia Type:General ? ?Level of Consciousness: drowsy and patient cooperative ? ?Airway & Oxygen Therapy: Patient Spontanous Breathing and Patient connected to nasal cannula oxygen ? ?Post-op Assessment: Report given to RN and Post -op Vital signs reviewed and stable ? ?Post vital signs: Reviewed and stable ? ?Last Vitals:  ?Vitals Value Taken Time  ?BP 132/82 06/05/21 1033  ?Temp    ?Pulse 69 06/05/21 1033  ?Resp 15 06/05/21 1033  ?SpO2 91 % 06/05/21 1033  ?Vitals shown include unvalidated device data. ? ?Last Pain:  ?Vitals:  ? 06/05/21 1033  ?TempSrc:   ?PainSc: Asleep  ?   ? ?  ? ?Complications: No notable events documented. ?

## 2021-06-05 NOTE — Anesthesia Preprocedure Evaluation (Signed)
Anesthesia Evaluation  ?Patient identified by MRN, date of birth, ID band ?Patient awake ? ? ? ?Reviewed: ?Allergy & Precautions, H&P , NPO status , Patient's Chart, lab work & pertinent test results, reviewed documented beta blocker date and time  ? ?History of Anesthesia Complications ?Negative for: history of anesthetic complications ? ?Airway ?Mallampati: I ? ?TM Distance: >3 FB ?Neck ROM: full ? ? ? Dental ? ?(+) Dental Advidsory Given, Caps, Teeth Intact, Missing ?Bridge x3:   ?Pulmonary ?neg shortness of breath, neg COPD, neg recent URI, former smoker,  ?Aspiration event in 2022 during shoulder surgery. ?  ?Pulmonary exam normal ?breath sounds clear to auscultation ? ? ? ? ? ? Cardiovascular ?Exercise Tolerance: Good ?hypertension, (-) angina+ CAD, + Past MI and + Cardiac Stents  ?Normal cardiovascular exam+ dysrhythmias (LBBB) (-) pacemaker(-) Valvular Problems/Murmurs ?Rhythm:regular Rate:Normal ? ? ?  ?Neuro/Psych ?PSYCHIATRIC DISORDERS Anxiety Depression negative neurological ROS ?   ? GI/Hepatic ?negative GI ROS, Neg liver ROS,   ?Endo/Other  ?negative endocrine ROS ? Renal/GU ?negative Renal ROS  ?negative genitourinary ?  ?Musculoskeletal ? ? Abdominal ?  ?Peds ? Hematology ?negative hematology ROS ?(+)   ?Anesthesia Other Findings ?Past Medical History: ?No date: Anxiety ?No date: Arthritis ?No date: Carotid stenosis, bilateral ?No date: Coronary artery disease ?No date: Depression ?No date: Hyperlipidemia ?No date: Hypertension ?12/28/2018: LBBB (left bundle branch block) ?    Comment:  Noted on EKG ?No date: Migraines ?No date: Neuropathy ?No date: STEMI (ST elevation myocardial infarction) ([place]) ?No date: SUI (stress urinary incontinence, female) ?No date: Uterine prolapse ?No date: Varicose veins ? ? Reproductive/Obstetrics ?negative OB ROS ? ?  ? ? ? ? ? ? ? ? ? ? ? ? ? ?  ?  ? ? ? ? ? ? ? ? ?Anesthesia Physical ?Anesthesia Plan ? ?ASA: 2 ? ?Anesthesia Plan:  General  ? ?Post-op Pain Management:   ? ?Induction: Intravenous ? ?PONV Risk Score and Plan: 3 and Propofol infusion and TIVA ? ?Airway Management Planned: Natural Airway and Nasal Cannula ? ?Additional Equipment:  ? ?Intra-op Plan:  ? ?Post-operative Plan:  ? ?Informed Consent: I have reviewed the patients History and Physical, chart, labs and discussed the procedure including the risks, benefits and alternatives for the proposed anesthesia with the patient or authorized representative who has indicated his/her understanding and acceptance.  ? ? ? ?Dental Advisory Given ? ?Plan Discussed with: Anesthesiologist, CRNA and Surgeon ? ?Anesthesia Plan Comments:   ? ? ? ? ? ? ?Anesthesia Quick Evaluation ? ?

## 2021-06-05 NOTE — Telephone Encounter (Signed)
Place referral

## 2021-06-05 NOTE — Telephone Encounter (Signed)
-----   Message from Lin Landsman, MD sent at 06/05/2021  4:16 PM EST ----- ?Regarding: Referral ?Dr. Rush Landmark ? ?I did this patient's colonoscopy today.  She has very large LSP in the cecum, not involving the IC valve.  I tried to take good pictures for your review.  I will place a referral if you think this can be resectable by EMR ? ?Thanks ?Rohini Vanga ? ?

## 2021-06-05 NOTE — Op Note (Signed)
[facility]astroenter[facility] Patient Name: Chelsea Fitzgerald Procedure Date: 06/05/2021 9:37 AM MRN: [id number] Account #: [id number] Date of Birth: 06-25-1945 Admit Type: Outpatient Age: 76 Room: [facility] ENDO ROOM 3 Gender: Female Note Status: Finalized Instrument Name: Peds Colonoscope [id number] Procedure:             Colonoscopy Indications:           Last colonoscopy: June 2012, Last colonoscopy 10 years                         ago, Lower abdominal pain Providers:             Lin Landsman MD, MD Referring MD:          Caprice Renshaw MD (Referring MD) Medicines:             General Anesthesia Complications:         No immediate complications. Estimated blood loss: None. Procedure:             Pre-Anesthesia Assessment:                        - Prior to the procedure, a History and Physical was                         performed, and patient medications and allergies were                         reviewed. The patient is competent. The risks and                         benefits of the procedure and the sedation options and                         risks were discussed with the patient. All questions                         were answered and informed consent was obtained.                         Patient identification and proposed procedure were                         verified by the physician, the nurse, the                         anesthesiologist, the anesthetist and the technician                         in the pre-procedure area in the procedure room in the                         endoscopy suite. Mental Status Examination: alert and                         oriented. Airway Examination: normal oropharyngeal                         airway and neck mobility. Respiratory Examination:  clear to auscultation. CV Examination: normal.                         Prophylactic Antibiotics: The patient does not require                          prophylactic antibiotics. Prior Anticoagulants: The                         patient has taken no previous anticoagulant or                         antiplatelet agents. ASA Grade Assessment: II - A                         patient with mild systemic disease. After reviewing                         the risks and benefits, the patient was deemed in                         satisfactory condition to undergo the procedure. The                         anesthesia plan was to use general anesthesia.                         Immediately prior to administration of medications,                         the patient was re-assessed for adequacy to receive                         sedatives. The heart rate, respiratory rate, oxygen                         saturations, blood pressure, adequacy of pulmonary                         ventilation, and response to care were monitored                         throughout the procedure. The physical status of the                         patient was re-assessed after the procedure.                        After obtaining informed consent, the colonoscope was                         passed under direct vision. Throughout the procedure,                         the patient's blood pressure, pulse, and oxygen                         saturations were monitored continuously. The  Colonoscope was introduced through the anus and                         advanced to the the cecum, identified by appendiceal                         orifice and ileocecal valve. The colonoscopy was                         performed without difficulty. The patient tolerated                         the procedure well. The quality of the bowel                         preparation was evaluated using the BBPS [facility] Bowel                         Preparation Scale) with scores of: Right Colon = 2                         (minor amount of residual staining, small fragments of                          stool and/or opaque liquid, but mucosa seen well),                         Transverse Colon = 2 (minor amount of residual                         staining, small fragments of stool and/or opaque                         liquid, but mucosa seen well) and Left Colon = 2                         (minor amount of residual staining, small fragments of                         stool and/or opaque liquid, but mucosa seen well). The                         total BBPS score equals 6. Findings:      The perianal and digital rectal examinations were normal. Pertinent       negatives include normal sphincter tone and no palpable rectal lesions.      A greater than 50 mm polyp was found in the cecum. The polyp was       carpet-like and flat. Polypectomy was not attempted due to polyp size       (too large to be excised).      A 15 mm polyp was found in the proximal ascending colon. The polyp was       carpet-like and flat. Preparations were made for mucosal resection. NBI       was done to mark the borders of the lesion. Eleview was injected with       adequate lift of the lesion from the muscularis propria.  Snare mucosal       resection with suction (via the working channel) retrieval was       performed. A 20 mm area was resected. Resection was complete, and       retrieval was complete. There was no bleeding during and at the end of       the procedure. To prevent bleeding after mucosal resection, three       hemostatic clips were successfully placed (MR conditional). There was no       bleeding during, or at the end, of the procedure.      A 8 mm polyp was found in the descending colon. The polyp was sessile.       The polyp was removed with a hot snare. Resection and retrieval were       complete. Estimated blood loss: none.      A 4 mm polyp was found in the descending colon. The polyp was sessile.       The polyp was removed with a cold snare. Resection and retrieval were        complete.      Multiple small and large-mouthed diverticula were found in the       recto-sigmoid colon and sigmoid colon. There was no evidence of       diverticular bleeding.      Non-bleeding external hemorrhoids were found during retroflexion. The       hemorrhoids were medium-sized. Impression:            - One greater than 50 mm polyp in the cecum. Resection                         not attempted.                        - One 15 mm polyp in the proximal ascending colon,                         removed with mucosal resection. Resected and                         retrieved. Clips (MR conditional) were placed.                        - One 8 mm polyp in the descending colon, removed with                         a hot snare. Resected and retrieved.                        - One 4 mm polyp in the descending colon, removed with                         a cold snare. Resected and retrieved.                        - Severe diverticulosis in the recto-sigmoid colon and                         in the sigmoid colon. There was no evidence of  diverticular bleeding.                        - Non-bleeding external hemorrhoids.                        - Mucosal resection was performed. Resection was                         complete, and retrieval was complete. Recommendation:        - Discharge patient to home (with escort).                        - Resume previous diet today.                        - Continue present medications.                        - Await pathology results.                        - Refer to an advanced endoscopist at an appointment                         to be scheduled for EMR of large flat polyp in the                         cecum.                        - Repeat colonoscopy date to be determined after                         pending pathology results are reviewed for                         surveillance based on pathology results. Procedure Code(s):      --- Professional ---                        [phone], Colonoscopy, flexible; with endoscopic mucosal                         resection                        45385, 52, Colonoscopy, flexible; with removal of                         tumor(s), polyp(s), or other lesion(s) by snare                         technique Diagnosis Code(s):     --- Professional ---                        K63.5, Polyp of colon                        K64.4, Residual hemorrhoidal skin tags  R10.30, Lower abdominal pain, unspecified                        K57.30, Diverticulosis of large intestine without                         perforation or abscess without bleeding CPT copyright 2019 American Medical Association. All rights reserved. The codes documented in this report are preliminary and upon coder review may  be revised to meet current compliance requirements. Dr. Ulyess Mort Lin Landsman MD, MD 06/05/2021 10:34:38 AM This report has been signed electronically. Number of Addenda: 0 Note Initiated On: 06/05/2021 9:37 AM Scope Withdrawal Time: 0 hours 35 minutes 52 seconds  Total Procedure Duration: 0 hours 40 minutes 40 seconds  Estimated Blood Loss:  Estimated blood loss: none.      [facility]

## 2021-06-06 ENCOUNTER — Encounter: Payer: Self-pay | Admitting: Gastroenterology

## 2021-06-06 ENCOUNTER — Telehealth: Payer: Self-pay | Admitting: Gastroenterology

## 2021-06-06 LAB — SURGICAL PATHOLOGY

## 2021-06-06 NOTE — Telephone Encounter (Signed)
Called patient to discuss about the pathology results from colon polyps that were removed.  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  Informed her that I have reviewed the colonoscopy findings of the large polyp in the cecum with Dr. Rush Landmark and he is willing to attempt resection.  Placed referral to him and patient agreed ? ?Sherri Sear, MD ?

## 2021-06-13 ENCOUNTER — Telehealth: Payer: Self-pay

## 2021-06-13 NOTE — Telephone Encounter (Signed)
-----   Message from Irving Copas., MD sent at 06/05/2021  5:25 PM EST ----- ?Regarding: RE: Referral ?Lanae Boast, ?Thanks for the reply. ?Denae Zulueta, please move forward with scheduling as noted below. ?GM ?----- Message ----- ?From: Lin Landsman, MD ?Sent: 06/05/2021   5:20 PM EST ?To: Timothy Lasso, RN, Irving Copas., MD, # ?Subject: RE: Referral                                  ? ?Yes, she would like to see you, I have already discussed with her right after the colonoscopy ? ?RV ?----- Message ----- ?From: Mansouraty, Telford Nab., MD ?Sent: 06/05/2021   5:18 PM EST ?To: Rohini Raeanne Gathers, MD, Timothy Lasso, RN, # ?Subject: RE: Referral                                  ? ?RV, ?Quite a polyp.  I am happy to be of assistance if the patient wants an attempt at EMR removal.  Please reply back once she gives the okay and Chong Sicilian can work on scheduling. ?Chandra Feger once we get the okay from Dr. Marius Ditch then proceed with a clinic visit (okay to use one of my held spots or a 3:50 PM slot.  Can offer her a colonoscopy with EMR slot as well unless she wants to wait until clinic visit to discuss. ?Thanks. ?GM ?----- Message ----- ?From: Lin Landsman, MD ?Sent: 06/05/2021   4:18 PM EST ?To: Irving Copas., MD, # ?Subject: Referral                                      ? ?Dr. Rush Landmark ? ?I did this patient's colonoscopy today.  She has very large LSP in the cecum, not involving the IC valve.  I tried to take good pictures for your review.  I will place a referral if you think this can be resectable by EMR ? ?Thanks ?Rohini Vanga ? ? ? ? ?

## 2021-06-13 NOTE — Telephone Encounter (Signed)
I spoke with the pt and she prefers to come into the office and discuss prior to making the appt.  She was scheduled for 3/22 at 350 pm. ?

## 2021-06-20 ENCOUNTER — Ambulatory Visit: Payer: Medicare HMO | Admitting: Gastroenterology

## 2021-06-20 ENCOUNTER — Other Ambulatory Visit (INDEPENDENT_AMBULATORY_CARE_PROVIDER_SITE_OTHER): Payer: Medicare HMO

## 2021-06-20 ENCOUNTER — Encounter: Payer: Self-pay | Admitting: Gastroenterology

## 2021-06-20 VITALS — BP 128/84 | HR 62 | Ht 62.0 in | Wt 157.4 lb

## 2021-06-20 DIAGNOSIS — R933 Abnormal findings on diagnostic imaging of other parts of digestive tract: Secondary | ICD-10-CM

## 2021-06-20 DIAGNOSIS — Z86010 Personal history of colonic polyps: Secondary | ICD-10-CM

## 2021-06-20 DIAGNOSIS — K635 Polyp of colon: Secondary | ICD-10-CM | POA: Diagnosis not present

## 2021-06-20 LAB — BASIC METABOLIC PANEL
BUN: 23 mg/dL (ref 6–23)
CO2: 30 mEq/L (ref 19–32)
Calcium: 9.9 mg/dL (ref 8.4–10.5)
Chloride: 101 mEq/L (ref 96–112)
Creatinine, Ser: 0.87 mg/dL (ref 0.40–1.20)
GFR: 64.8 mL/min (ref >60.00)
Glucose, Bld: 85 mg/dL (ref 70–99)
Potassium: 4.2 mEq/L (ref 3.5–5.1)
Sodium: 138 mEq/L (ref 135–145)

## 2021-06-20 MED ORDER — SUTAB 1479-225-188 MG PO TABS: 24.0000 | ORAL_TABLET | ORAL | 0 refills | Status: DC

## 2021-06-20 NOTE — Patient Instructions (Addendum)
We have sent the following medications to your pharmacy for you to pick up at your convenience: ?Sutab  ? ?You have been scheduled for a colonoscopy. Please follow written instructions given to you at your visit today.  ?Please pick up your prep supplies at the pharmacy within the next 1-3 days. ?If you use inhalers (even only as needed), please bring them with you on the day of your procedure. ? ?Your provider has requested that you go to the basement level for lab work before leaving today. Press "B" on the elevator. The lab is located at the first door on the left as you exit the elevator. ? ? ?If you are age 65 or older, your body mass index should be between 23-30. Your Body mass index is 28.79 kg/m?Marland Kitchen If this is out of the aforementioned range listed, please consider follow up with your Primary Care Provider. ? ?If you are age 43 or younger, your body mass index should be between 19-25. Your Body mass index is 28.79 kg/m?Marland Kitchen If this is out of the aformentioned range listed, please consider follow up with your Primary Care Provider.  ? ?________________________________________________________ ? ?The [facility] providers would like to encourage you to use [facility] to communicate with providers for non-urgent requests or questions.  Due to long hold times on the telephone, sending your provider a message by [facility] may be a faster and more efficient way to get a response.  Please allow 48 business hours for a response.  Please remember that this is for non-urgent requests.  ?_______________________________________________________ ? ? ? ?Thank you for choosing me and [facility]. ? ?Dr. Rush Landmark ? ?

## 2021-06-21 ENCOUNTER — Encounter: Payer: Self-pay | Admitting: Gastroenterology

## 2021-06-21 DIAGNOSIS — Z860101 Personal history of adenomatous and serrated colon polyps: Secondary | ICD-10-CM | POA: Insufficient documentation

## 2021-06-21 DIAGNOSIS — R933 Abnormal findings on diagnostic imaging of other parts of digestive tract: Secondary | ICD-10-CM | POA: Insufficient documentation

## 2021-06-21 DIAGNOSIS — Z86010 Personal history of colonic polyps: Secondary | ICD-10-CM | POA: Insufficient documentation

## 2021-06-21 DIAGNOSIS — K635 Polyp of colon: Secondary | ICD-10-CM | POA: Insufficient documentation

## 2021-06-21 LAB — CBC
HCT: 41.2 % (ref 36.0–46.0)
Hemoglobin: 13.8 g/dL (ref 12.0–15.0)
MCHC: 33.6 g/dL (ref 30.0–36.0)
MCV: 90.9 fl (ref 78.0–100.0)
Platelets: 235 10*3/uL (ref 150.0–400.0)
RBC: 4.54 Mil/uL (ref 3.87–5.11)
RDW: 14.7 % (ref 11.5–15.5)
WBC: 6.2 10*3/uL (ref 4.0–10.5)

## 2021-06-21 LAB — PROTIME-INR
INR: 1.1 ratio — ABNORMAL HIGH (ref 0.8–1.0)
Prothrombin Time: 12 s (ref 9.6–13.1)

## 2021-06-21 NOTE — Progress Notes (Signed)
? ?GASTROENTEROLOGY OUTPATIENT CLINIC VISIT  ? ?Primary Care Provider ?Derinda Late, MD ?[address] ?[place] [address] ?[phone] ? ?Referring Provider ?Dr. Marius Ditch ? ?Patient Profile: ?Chelsea Fitzgerald is a 76 y.o. female with a pmh significant for CAD, carotid artery disease, hypertension, hyperlipidemia, arthritis, anxiety, diverticulosis, colon polyps (TAs and large cecal polyp left in situ).  The patient presents to the [facility] for an evaluation and management of problem(s) noted below: ? ?Problem List ?1. Cecal polyp   ?2. Hx of adenomatous colonic polyps   ?3. Abnormal colonoscopy   ? ? ?History of Present Illness ?This is a patient who is referred by Dr. Marius Ditch for further evaluation and consideration of advanced polyp resection after undergoing a colonoscopy earlier this month that showed multiple colon polyps as well as a greater than 50 mm polyp in the cecum that was carpet like and flat.  The patient initially saw Dr. Marius Ditch for evaluation of lower abdominal pain.  She had undergone a colonoscopy over 10 years prior and was told that it was normal.  The patient has had no other changes in her status since her visit to Dr. Marius Ditch in February.  She denies any changes in her bowel habits or blood in her stools.  She feels that constipation is improved now that she takes fiber on a regular basis.   ? ?GI Review of Systems ?Positive as above ?Negative for dysphagia, odynophagia, nausea, vomiting ? ?Review of Systems ?General: Denies fevers/chills/weight loss unintentionally ?Cardiovascular: Denies chest pain ?Pulmonary: Denies shortness of breath ?Gastroenterological: See HPI ?Genitourinary: Denies darkened urine ?Hematological: Denies easy bruising/bleeding ?Dermatological: Denies jaundice ?Psychological: Mood is stable ? ? ?Medications ?Current Outpatient Medications  ?Medication Sig Dispense Refill  ? ALPRAZolam (XANAX) 0.5  MG tablet Take 0.25 mg by mouth at bedtime.     ? citalopram (CELEXA) 20 MG tablet Take 20 mg by mouth daily.   3  ? estradiol (ESTRACE) 0.1 MG/GM vaginal cream Place vaginally.    ? fluticasone (FLONASE) 50 MCG/ACT nasal spray Place 2 sprays into both nostrils daily.     ? gabapentin (NEURONTIN) 300 MG capsule Take by mouth. 1 in morning, 1 in afternoon and 2 at night    ? loratadine (CLARITIN) 10 MG tablet Take 10 mg by mouth daily as needed for allergies.     ? losartan-hydrochlorothiazide (HYZAAR) 50-12.5 MG tablet Take 1 tablet by mouth daily.     ? Melatonin 3 MG TBDP Take 1 tablet by mouth at bedtime.    ? metoprolol succinate (TOPROL-XL) 50 MG 24 hr tablet Take 50 mg by mouth daily.     ? montelukast (SINGULAIR) 10 MG tablet Take 10 mg by mouth at bedtime.    ? rosuvastatin (CRESTOR) 5 MG tablet Take 5 mg by mouth daily.    ? Sodium Sulfate-Mag Sulfate-KCl (SUTAB) [phone] MG TABS Take 24 tablets by mouth as directed. 24 tablet 0  ? ?No current facility-administered medications for this visit.  ? ? ?Allergies ?Allergies  ?Allergen Reactions  ? Corticosteroids Rash  ? Ace Inhibitors Other (See Comments)  ?  Mouth ulcers  ? Lipitor [Atorvastatin] Diarrhea  ? Lisinopril Itching  ? Simvastatin Diarrhea  ? Statins Diarrhea  ? ? ?Histories ?Past Medical History:  ?Diagnosis Date  ? Anxiety   ? Arthritis   ? Carotid stenosis, bilateral   ? Coronary artery disease   ? Depression   ? Hyperlipidemia   ?  Hypertension   ? LBBB (left bundle branch block) 12/28/2018  ? Noted on EKG  ? Migraines   ? Neuropathy   ? STEMI (ST elevation myocardial infarction) ([employer])   ? SUI (stress urinary incontinence, female)   ? Uterine prolapse   ? Varicose veins   ? ?Past Surgical History:  ?Procedure Laterality Date  ? BREAST BIOPSY Left 03/20/2016  ? FRAGMENT OF CYST WALL.  ? BREAST BIOPSY Right 05/29/2017  ? ribbon clip, benign  ? CAROTID STENT    ? CHOLECYSTECTOMY    ? COLONOSCOPY    ? COLONOSCOPY WITH PROPOFOL N/A 06/05/2021  ?  Procedure: COLONOSCOPY WITH PROPOFOL;  Surgeon: Lin Landsman, MD;  Location: [facility];  Service: Gastroenterology;  Laterality: N/A;  ? CORONARY ANGIOPLASTY    ? PARTIAL KNEE ARTHROPLASTY Left 01/18/2019  ? Procedure: Left knee medial unicompartmental arthroplasty;  Surgeon: Gaynelle Arabian, MD;  Location: [facility];  Service: Orthopedics;  Laterality: Left;  77mn  ? SHOULDER SURGERY Right 10/2020  ? UMBILICAL HERNIA REPAIR    ? ?Social History  ? ?Socioeconomic History  ? Marital status: Married  ?  Spouse name: Not on file  ? Number of children: 2  ? Years of education: Not on file  ? Highest education level: Not on file  ?Occupational History  ? Not on file  ?Tobacco Use  ? Smoking status: Former  ?  Packs/day: 1.00  ?  Types: Cigarettes  ?  Quit date: 01/22/2012  ?  Years since quitting: 9.4  ? Smokeless tobacco: Never  ? Tobacco comments:  ?  49   ?Vaping Use  ? Vaping Use: Never used  ?Substance and Sexual Activity  ? Alcohol use: Yes  ?  Comment: occas  ? Drug use: Never  ? Sexual activity: Not Currently  ?Other Topics Concern  ? Not on file  ?Social History Narrative  ? Not on file  ? ?Social Determinants of Health  ? ?Financial Resource Strain: Not on file  ?Food Insecurity: Not on file  ?Transportation Needs: Not on file  ?Physical Activity: Not on file  ?Stress: Not on file  ?Social Connections: Not on file  ?Intimate Partner Violence: Not on file  ? ?Family History  ?Problem Relation Age of Onset  ? Prostate cancer Father   ? Hypertension Father   ? Heart attack Father   ? Stroke Father   ? Breast cancer Sister 685 ? Breast cancer Daughter 547 ? Colon cancer Neg Hx   ? Esophageal cancer Neg Hx   ? Stomach cancer Neg Hx   ? Pancreatic cancer Neg Hx   ? Colon polyps Neg Hx   ? Inflammatory bowel disease Neg Hx   ? Liver disease Neg Hx   ? Rectal cancer Neg Hx   ? ?I have reviewed her medical, social, and family history in detail and updated the electronic medical record as necessary.   ? ? ?PHYSICAL EXAMINATION  ?BP 128/84   Pulse 62   Ht '5\' 2"'$  (1.575 m)   Wt 157 lb 6.4 oz (71.4 kg)   SpO2 97%   BMI 28.79 kg/m?  ?Wt Readings from Last 3 Encounters:  ?06/20/21 157 lb 6.4 oz (71.4 kg)  ?06/05/21 151 lb (68.5 kg)  ?05/03/21 157 lb 6.4 oz (71.4 kg)  ?GEN: NAD, appears stated age, doesn't appear chronically ill, accompanied by husband ?PSYCH: Cooperative, without pressured speech ?EYE: Conjunctivae pink, sclerae anicteric ?ENT: MMM ?CV: RR without R/Gs  ?RESP: CTAB posteriorly,  without wheezing ?GI: NABS, soft, rounded, NT/ND, without rebound or guarding ?MSK/EXT: Trace bilateral pedal edema present ?SKIN: No jaundice ?NEURO:  Alert & Oriented x 3, no focal deficits ? ? ?REVIEW OF DATA  ?I reviewed the following data at the time of this encounter: ? ?GI Procedures and Studies  ?March 2023 colonoscopy ?- One greater than 50 mm polyp in the cecum. Resection not attempted. ?- One 15 mm polyp in the proximal ascending colon, removed with mucosal resection. Resected and retrieved.  Clips (MR conditional) were placed. ?- One 8 mm polyp in the descending colon, removed with a hot snare. Resected and retrieved. ?- One 4 mm polyp in the descending colon, removed with a cold snare. Resected and retrieved. ?- Severe diverticulosis in the recto-sigmoid colon and in the sigmoid colon. There was no evidence of diverticular bleeding. ?- Non-bleeding external hemorrhoids. ?- Mucosal resection was performed. Resection was complete, and retrieval was complete. ? ?Pathology ?DIAGNOSIS:  ?A. COLON POLYP, ASCENDING; HOT SNARE:  ?- TUBULAR ADENOMA, MULTIPLE FRAGMENTS.  ?- NEGATIVE FOR HIGH GRADE DYSPLASIA AND MALIGNANCY.  ?B. COLON POLYP X2, DESCENDING; HOT AND COLD SNARE:  ?- TUBULAR ADENOMA, MULTIPLE FRAGMENTS.  ?- NEGATIVE FOR HIGH GRADE DYSPLASIA AND MALIGNANCY.  ? ?Laboratory Studies  ?Reviewed those in epic ? ?Imaging Studies  ?No relevant studies to review ? ? ?ASSESSMENT  ?Chelsea Fitzgerald is a 76 y.o. female with a  pmh significant for ?The patient is seen today for evaluation and management of: ? ?1. Cecal polyp   ?2. Hx of adenomatous colonic polyps   ?3. Abnormal colonoscopy   ? ?The patient is clinically and hemodynamically stabl

## 2021-08-13 ENCOUNTER — Encounter (HOSPITAL_COMMUNITY): Payer: Self-pay | Admitting: Gastroenterology

## 2021-08-20 ENCOUNTER — Encounter (HOSPITAL_COMMUNITY): Payer: Self-pay | Admitting: Gastroenterology

## 2021-08-20 ENCOUNTER — Encounter (HOSPITAL_COMMUNITY)
Admission: RE | Disposition: A | Discharge status: Home / Self Care | Payer: Self-pay | Source: Home / Self Care | Attending: Gastroenterology

## 2021-08-20 ENCOUNTER — Ambulatory Visit (HOSPITAL_COMMUNITY): Payer: Medicare HMO | Admitting: Anesthesiology

## 2021-08-20 ENCOUNTER — Ambulatory Visit (HOSPITAL_COMMUNITY)
Admission: RE | Admit: 2021-08-20 | Discharge: 2021-08-20 | Disposition: A | Discharge status: Home / Self Care | Payer: Medicare HMO | Attending: Gastroenterology | Admitting: Gastroenterology

## 2021-08-20 ENCOUNTER — Other Ambulatory Visit: Payer: Self-pay

## 2021-08-20 ENCOUNTER — Ambulatory Visit (HOSPITAL_BASED_OUTPATIENT_CLINIC_OR_DEPARTMENT_OTHER): Payer: Medicare HMO | Admitting: Anesthesiology

## 2021-08-20 DIAGNOSIS — Z87891 Personal history of nicotine dependence: Secondary | ICD-10-CM | POA: Insufficient documentation

## 2021-08-20 DIAGNOSIS — I252 Old myocardial infarction: Secondary | ICD-10-CM | POA: Diagnosis not present

## 2021-08-20 DIAGNOSIS — D120 Benign neoplasm of cecum: Secondary | ICD-10-CM | POA: Diagnosis not present

## 2021-08-20 DIAGNOSIS — D122 Benign neoplasm of ascending colon: Secondary | ICD-10-CM | POA: Diagnosis not present

## 2021-08-20 DIAGNOSIS — K644 Residual hemorrhoidal skin tags: Secondary | ICD-10-CM | POA: Diagnosis not present

## 2021-08-20 DIAGNOSIS — K641 Second degree hemorrhoids: Secondary | ICD-10-CM

## 2021-08-20 DIAGNOSIS — R933 Abnormal findings on diagnostic imaging of other parts of digestive tract: Secondary | ICD-10-CM

## 2021-08-20 DIAGNOSIS — I2510 Atherosclerotic heart disease of native coronary artery without angina pectoris: Secondary | ICD-10-CM | POA: Diagnosis not present

## 2021-08-20 DIAGNOSIS — Z955 Presence of coronary angioplasty implant and graft: Secondary | ICD-10-CM | POA: Insufficient documentation

## 2021-08-20 DIAGNOSIS — K635 Polyp of colon: Secondary | ICD-10-CM | POA: Diagnosis not present

## 2021-08-20 DIAGNOSIS — K5730 Diverticulosis of large intestine without perforation or abscess without bleeding: Secondary | ICD-10-CM | POA: Diagnosis not present

## 2021-08-20 DIAGNOSIS — I10 Essential (primary) hypertension: Secondary | ICD-10-CM | POA: Diagnosis not present

## 2021-08-20 DIAGNOSIS — Z86010 Personal history of colonic polyps: Secondary | ICD-10-CM

## 2021-08-20 HISTORY — PX: HEMOSTASIS CLIP PLACEMENT: SHX6857

## 2021-08-20 HISTORY — PX: SUBMUCOSAL LIFTING INJECTION: SHX6855

## 2021-08-20 HISTORY — PX: POLYPECTOMY: SHX5525

## 2021-08-20 HISTORY — PX: COLONOSCOPY WITH PROPOFOL: SHX5780

## 2021-08-20 HISTORY — PX: ENDOSCOPIC MUCOSAL RESECTION: SHX6839

## 2021-08-20 LAB — GLUCOSE, CAPILLARY: Glucose-Capillary: 94 mg/dL (ref 70–99)

## 2021-08-20 SURGERY — COLONOSCOPY WITH PROPOFOL
Anesthesia: Monitor Anesthesia Care

## 2021-08-20 MED ORDER — PROPOFOL 500 MG/50ML IV EMUL
INTRAVENOUS | Status: AC
  Filled 2021-08-20: qty 50

## 2021-08-20 MED ORDER — LACTATED RINGERS IV SOLN
INTRAVENOUS | Status: AC | PRN
Start: 2021-08-20 — End: 2021-08-20
  Administered 2021-08-20: 1000 mL via INTRAVENOUS

## 2021-08-20 MED ORDER — LACTATED RINGERS IV SOLN
INTRAVENOUS | Status: DC
Start: 2021-08-20 — End: 2021-08-20

## 2021-08-20 MED ORDER — PROPOFOL 10 MG/ML IV BOLUS
INTRAVENOUS | Status: AC
  Filled 2021-08-20: qty 20

## 2021-08-20 MED ORDER — SODIUM CHLORIDE 0.9 % IV SOLN: INTRAVENOUS | Status: DC

## 2021-08-20 MED ORDER — PROPOFOL 10 MG/ML IV BOLUS
INTRAVENOUS | Status: DC | PRN
  Administered 2021-08-20 (×3): 20 mg via INTRAVENOUS

## 2021-08-20 MED ORDER — PROPOFOL 500 MG/50ML IV EMUL
INTRAVENOUS | Status: DC | PRN
  Administered 2021-08-20: 125 ug/kg/min via INTRAVENOUS

## 2021-08-20 SURGICAL SUPPLY — 22 items

## 2021-08-20 NOTE — Anesthesia Procedure Notes (Signed)
Procedure Name: MAC Date/Time: 08/20/2021 11:41 AM Performed by: Lollie Sails, CRNA Pre-anesthesia Checklist: Patient identified, Emergency Drugs available, Suction available, Patient being monitored and Timeout performed Oxygen Delivery Method: Simple face mask Placement Confirmation: positive ETCO2

## 2021-08-20 NOTE — Transfer of Care (Signed)
Immediate Anesthesia Transfer of Care Note  Patient: Chelsea Fitzgerald  Procedure(s) Performed: COLONOSCOPY WITH PROPOFOL ENDOSCOPIC MUCOSAL RESECTION SUBMUCOSAL LIFTING INJECTION HEMOSTASIS CLIP PLACEMENT POLYPECTOMY  Patient Location: PACU and Endoscopy Unit  Anesthesia Type:MAC  Level of Consciousness: awake, alert  and oriented  Airway & Oxygen Therapy: Patient Spontanous Breathing and Patient connected to face mask oxygen  Post-op Assessment: Report given to RN and Post -op Vital signs reviewed and stable  Post vital signs: Reviewed and stable  Last Vitals:  Vitals Value Taken Time  BP 138/76 08/20/21 1304  Temp    Pulse 62 08/20/21 1305  Resp 15 08/20/21 1305  SpO2 95 % 08/20/21 1305  Vitals shown include unvalidated device data.  Last Pain:  Vitals:   08/20/21 1012  TempSrc: Oral  PainSc: 0-No pain         Complications: No notable events documented.

## 2021-08-20 NOTE — Anesthesia Postprocedure Evaluation (Signed)
Anesthesia Post Note  Patient: JERIE BASFORD  Procedure(s) Performed: COLONOSCOPY WITH PROPOFOL ENDOSCOPIC MUCOSAL RESECTION SUBMUCOSAL LIFTING INJECTION HEMOSTASIS CLIP PLACEMENT POLYPECTOMY     Patient location during evaluation: Endoscopy Anesthesia Type: MAC Level of consciousness: awake and alert Pain management: pain level controlled Vital Signs Assessment: post-procedure vital signs reviewed and stable Respiratory status: spontaneous breathing, nonlabored ventilation and respiratory function stable Cardiovascular status: blood pressure returned to baseline and stable Postop Assessment: no apparent nausea or vomiting Anesthetic complications: no   No notable events documented.  Last Vitals:  Vitals:   08/20/21 1320 08/20/21 1330  BP: (!) 172/80 (!) 179/83  Pulse: (!) 57 (!) 58  Resp: 15 17  Temp: (!) 36.3 C (!) 36.2 C  SpO2: 97% 92%    Last Pain:  Vitals:   08/20/21 1330  TempSrc: Temporal  PainSc: 0-No pain                 Lidia Collum

## 2021-08-20 NOTE — Anesthesia Preprocedure Evaluation (Signed)
Anesthesia Evaluation  Patient identified by MRN, date of birth, ID band Patient awake    Reviewed: Allergy & Precautions, NPO status , Patient's Chart, lab work & pertinent test results, reviewed documented beta blocker date and time   History of Anesthesia Complications Negative for: history of anesthetic complications  Airway Mallampati: I  TM Distance: >3 FB Neck ROM: Full    Dental  (+) Teeth Intact   Pulmonary neg pulmonary ROS, former smoker,    Pulmonary exam normal        Cardiovascular hypertension, Pt. on medications and Pt. on home beta blockers + CAD, + Past MI and + Cardiac Stents  Normal cardiovascular exam     Neuro/Psych Anxiety Depression Bilateral carotid stenosis    GI/Hepatic Neg liver ROS, Colon polyps   Endo/Other  negative endocrine ROS  Renal/GU negative Renal ROS  negative genitourinary   Musculoskeletal negative musculoskeletal ROS (+)   Abdominal   Peds  Hematology negative hematology ROS (+)   Anesthesia Other Findings   Reproductive/Obstetrics                             Anesthesia Physical Anesthesia Plan  ASA: 3  Anesthesia Plan: MAC   Post-op Pain Management: Minimal or no pain anticipated   Induction: Intravenous  PONV Risk Score and Plan: 2 and Propofol infusion, TIVA and Treatment may vary due to age or medical condition  Airway Management Planned: Natural Airway, Nasal Cannula and Simple Face Mask  Additional Equipment: None  Intra-op Plan:   Post-operative Plan:   Informed Consent: I have reviewed the patients History and Physical, chart, labs and discussed the procedure including the risks, benefits and alternatives for the proposed anesthesia with the patient or authorized representative who has indicated his/her understanding and acceptance.       Plan Discussed with:   Anesthesia Plan Comments:         Anesthesia Quick  Evaluation

## 2021-08-20 NOTE — Op Note (Signed)
[facility] Patient Name: Chelsea Fitzgerald Procedure Date: 08/20/2021 MRN: [id number] Attending MD: Justice Britain , MD Date of Birth: 02-18-1946 CSN: [id number] Age: 76 Admit Type: Outpatient Procedure:                Colonoscopy Indications:              Excision of colonic polyp Providers:                Justice Britain, MD, Jeanella Cara, RN,                            [facility] Technician, Technician Referring MD:             Lin Landsman MD, MD, Caprice Renshaw MD Medicines:                Monitored Anesthesia Care Complications:            No immediate complications. Estimated Blood Loss:     Estimated blood loss was minimal. Procedure:                Pre-Anesthesia Assessment:                           - Prior to the procedure, a History and Physical                            was performed, and patient medications and                            allergies were reviewed. The patient's tolerance of                            previous anesthesia was also reviewed. The risks                            and benefits of the procedure and the sedation                            options and risks were discussed with the patient.                            All questions were answered, and informed consent                            was obtained. Prior Anticoagulants: The patient has                            taken no previous anticoagulant or antiplatelet                            agents. ASA Grade Assessment: III - A patient with                            severe systemic disease. After reviewing the risks  and benefits, the patient was deemed in                            satisfactory condition to undergo the procedure.                           After obtaining informed consent, the colonoscope                            was passed under direct vision. Throughout the                            procedure, the patient's  blood pressure, pulse, and                            oxygen saturations were monitored continuously. The                            [id number]-HQ190L ([id number]) Olympus colonoscope was                            introduced through the anus and advanced to the the                            cecum, identified by appendiceal orifice and                            ileocecal valve. The colonoscopy was technically                            difficult and complex due to significant looping.                            Successful completion of the procedure was aided by                            performing the maneuvers documented (below) in this                            report. The patient tolerated the procedure. The                            quality of the bowel preparation was adequate. The                            terminal ileum, ileocecal valve, appendiceal                            orifice, and rectum were photographed. Scope In: 11:50:38 AM Scope Out: 12:58:45 PM Scope Withdrawal Time: 1 hour 4 minutes 30 seconds  Total Procedure Duration: 1 hour 8 minutes 7 seconds  Findings:      The digital rectal exam findings include hemorrhoids. Pertinent       negatives include no palpable rectal lesions.  The terminal ileum and ileocecal valve appeared normal.      The colon revealed significantly excessive looping to achieve an       adequate visualization of the entire cecum. The patient was repositioned       in order to accomplish the maneuver in the near prone positioning and       supine positioning, but extreme left lateral positioning remained the       best at visualization..      A 45 mm polyp was found in the cecum. The polyp was sessile.       Preparations were made for mucosal resection. NBI imaging and       White-light endoscopy using NBI imaging and White-light endoscopy       chromoscopy technique was done to demarcate the borders of the lesion.       Everlift was injected to  raise the lesion. Piecemeal mucosal resection       using a snare was performed. Resection and retrieval were complete.       Fulguration to ablate the margin by snare tip soft cautery was       successful. To prevent bleeding after mucosal resection, five hemostatic       clips were successfully placed (MR conditional). There was no bleeding       at the end of the procedure.      A 4 mm polyp was found in the ascending colon. The polyp was sessile.       The polyp was removed with a cold snare. Resection and retrieval were       complete. (This polyp was placed into the jar with the EMR, so only 1       jar is noted today).      Multiple small-mouthed diverticula were found in the left colon.      Normal mucosa was found in the entire colon otherwise.      Non-bleeding non-thrombosed external and internal hemorrhoids were found       during retroflexion, during perianal exam and during digital exam. The       hemorrhoids were Grade II (internal hemorrhoids that prolapse but reduce       spontaneously). Impression:               - Hemorrhoids found on digital rectal exam.                           - The examined portion of the ileum was normal.                           - There was significant looping of the colon for                            adequate visualization of the cecum.                           - One 45 mm polyp in the cecum, removed with                            piecemeal mucosal resection. Resected and  retrieved. Treated with STSC. Clips (MR                            conditional) were placed.                           - One 4 mm polyp in the ascending colon, removed                            with a cold snare. Resected and retrieved. (Placed                            into Jar 1).                           - Diverticulosis in the left colon.                           - Normal mucosa in the entire examined colon                             otherwise.                           - Non-bleeding non-thrombosed external and internal                            hemorrhoids. Moderate Sedation:      Not Applicable - Patient had care per Anesthesia. Recommendation:           - The patient will be observed post-procedure,                            until all discharge criteria are met.                           - Discharge patient to home.                           - Patient has a contact number available for                            emergencies. The signs and symptoms of potential                            delayed complications were discussed with the                            patient. Return to normal activities tomorrow.                            Written discharge instructions were provided to the                            patient.                           -  High fiber diet.                           - Use FiberCon 1-2 tablets PO daily.                           - Continue present medications.                           - Await pathology results.                           - Repeat colonoscopy in 6-9 months for surveillance                            most likely.                           - The findings and recommendations were discussed                            with the patient.                           - The findings and recommendations were discussed                            with the patient's family. Procedure Code(s):        --- Professional ---                           [phone], Colonoscopy, flexible; with endoscopic                            mucosal resection                           [id number], 54, Colonoscopy, flexible; with removal of                            tumor(s), polyp(s), or other lesion(s) by snare                            technique Diagnosis Code(s):        --- Professional ---                           K64.1, Second degree hemorrhoids                           K63.5, Polyp of colon                            K57.30, Diverticulosis of large intestine without                            perforation or abscess without bleeding CPT copyright 2019 [employer] Medical [employer]. All rights reserved. The  codes documented in this report are preliminary and upon coder review may  be revised to meet current compliance requirements. Justice Britain, MD 08/20/2021 1:19:18 PM Number of Addenda: 0

## 2021-08-20 NOTE — H&P (Signed)
GASTROENTEROLOGY PROCEDURE H&P NOTE   Primary Care Physician: Derinda Late, MD  HPI: Chelsea Fitzgerald is a 76 y.o. female who presents for Colonoscopy for attempt at EMR of large Cecal polyp.  Past Medical History:  Diagnosis Date   Anxiety    Arthritis    Carotid stenosis, bilateral    Coronary artery disease    Depression    Hyperlipidemia    Hypertension    LBBB (left bundle branch block) 12/28/2018   Noted on EKG   Migraines    Neuropathy    STEMI (ST elevation myocardial infarction) (HCC)    SUI (stress urinary incontinence, female)    Uterine prolapse    Varicose veins    Past Surgical History:  Procedure Laterality Date   BREAST BIOPSY Left 03/20/2016   FRAGMENT OF CYST WALL.   BREAST BIOPSY Right 05/29/2017   ribbon clip, benign   CAROTID STENT     CHOLECYSTECTOMY     COLONOSCOPY     COLONOSCOPY WITH PROPOFOL N/A 06/05/2021   Procedure: COLONOSCOPY WITH PROPOFOL;  Surgeon: Lin Landsman, MD;  Location: [facility] ENDOSCOPY;  Service: Gastroenterology;  Laterality: N/A;   CORONARY ANGIOPLASTY     PARTIAL KNEE ARTHROPLASTY Left 01/18/2019   Procedure: Left knee medial unicompartmental arthroplasty;  Surgeon: Gaynelle Arabian, MD;  Location: [facility] ORS;  Service: Orthopedics;  Laterality: Left;  64mn   SHOULDER SURGERY Right 032/2025  UMBILICAL HERNIA REPAIR     Current Facility-Administered Medications  Medication Dose Route Frequency Provider Last Rate Last Admin   0.9 %  sodium chloride infusion   Intravenous Continuous Mansouraty, GTelford Nab, MD       lactated ringers infusion   Intravenous Continuous Mansouraty, GTelford Nab, MD       lactated ringers infusion    Continuous PRN Mansouraty, GTelford Nab, MD 10 mL/hr at 08/20/21 1130 Continued from Pre-op at 08/20/21 1130    Current Facility-Administered Medications:    0.9 %  sodium chloride infusion, , Intravenous, Continuous, Mansouraty, GTelford Nab, MD   lactated ringers infusion, , Intravenous, Continuous,  Mansouraty, GTelford Nab, MD   lactated ringers infusion, , , Continuous PRN, Mansouraty, GTelford Nab, MD, Last Rate: 10 mL/hr at 08/20/21 1130, Continued from Pre-op at 08/20/21 1130 Allergies  Allergen Reactions   Corticosteroids Rash   Ace Inhibitors Other (See Comments)    Mouth ulcers   Lipitor [Atorvastatin] Diarrhea   Lisinopril Itching   Simvastatin Diarrhea   Statins Diarrhea   Family History  Problem Relation Age of Onset   Prostate cancer Father    Hypertension Father    Heart attack Father    Stroke Father    Breast cancer Sister 646  Breast cancer Daughter 538  Colon cancer Neg Hx    Esophageal cancer Neg Hx    Stomach cancer Neg Hx    Pancreatic cancer Neg Hx    Colon polyps Neg Hx    Inflammatory bowel disease Neg Hx    Liver disease Neg Hx    Rectal cancer Neg Hx    Social History   Socioeconomic History   Marital status: Married    Spouse name: Not on file   Number of children: 2   Years of education: Not on file   Highest education level: Not on file  Occupational History   Not on file  Tobacco Use   Smoking status: Former    Packs/day: 1.00    Types: Cigarettes    Quit date:  01/22/2012    Years since quitting: 9.5   Smokeless tobacco: Never   Tobacco comments:    16   Vaping Use   Vaping Use: Never used  Substance and Sexual Activity   Alcohol use: Yes    Comment: occas   Drug use: Never   Sexual activity: Not Currently  Other Topics Concern   Not on file  Social History Narrative   Not on file   Social Determinants of Health   Financial Resource Strain: Not on file  Food Insecurity: Not on file  Transportation Needs: Not on file  Physical Activity: Not on file  Stress: Not on file  Social Connections: Not on file  Intimate Partner Violence: Not on file    Physical Exam: Today's Vitals   08/20/21 1012  BP: (!) 185/76  Pulse: (!) 57  Resp: 15  Temp: 97.6 F (36.4 C)  TempSrc: Oral  SpO2: 96%  Weight: 71.4 kg  Height:  '5\' 2"'$  (1.575 m)  PainSc: 0-No pain   Body mass index is 28.79 kg/m. GEN: NAD EYE: Sclerae anicteric ENT: MMM CV: Non-tachycardic GI: Soft, NT/ND NEURO:  Alert & Oriented x 3  Lab Results: No results for input(s): WBC, HGB, HCT, PLT in the last 72 hours. BMET No results for input(s): NA, K, CL, CO2, GLUCOSE, BUN, CREATININE, CALCIUM in the last 72 hours. LFT No results for input(s): PROT, ALBUMIN, AST, ALT, ALKPHOS, BILITOT, BILIDIR, IBILI in the last 72 hours. PT/INR No results for input(s): LABPROT, INR in the last 72 hours.   Impression / Plan: This is a 76 y.o.female who presents for Colonoscopy for attempt at EMR of large Cecal polyp.  The risks and benefits of endoscopic evaluation/treatment were discussed with the patient and/or family; these include but are not limited to the risk of perforation, infection, bleeding, missed lesions, lack of diagnosis, severe illness requiring hospitalization, as well as anesthesia and sedation related illnesses.  The patient's history has been reviewed, patient examined, no change in status, and deemed stable for procedure.  The patient and/or family is agreeable to proceed.    Justice Britain, MD [facility] Office # [phone]

## 2021-08-20 NOTE — Discharge Instructions (Signed)

## 2021-08-21 ENCOUNTER — Encounter (HOSPITAL_COMMUNITY): Payer: Self-pay | Admitting: Gastroenterology

## 2021-08-21 LAB — SURGICAL PATHOLOGY

## 2021-08-22 ENCOUNTER — Encounter: Payer: Self-pay | Admitting: Gastroenterology

## 2021-12-21 ENCOUNTER — Other Ambulatory Visit: Payer: Self-pay | Admitting: Family Medicine

## 2021-12-21 DIAGNOSIS — Z1231 Encounter for screening mammogram for malignant neoplasm of breast: Secondary | ICD-10-CM

## 2022-01-10 ENCOUNTER — Ambulatory Visit (INDEPENDENT_AMBULATORY_CARE_PROVIDER_SITE_OTHER): Payer: Medicare HMO | Admitting: Dermatology

## 2022-01-10 ENCOUNTER — Encounter: Payer: Self-pay | Admitting: Dermatology

## 2022-01-10 DIAGNOSIS — D229 Melanocytic nevi, unspecified: Secondary | ICD-10-CM

## 2022-01-10 DIAGNOSIS — Z1283 Encounter for screening for malignant neoplasm of skin: Secondary | ICD-10-CM | POA: Diagnosis not present

## 2022-01-10 DIAGNOSIS — L821 Other seborrheic keratosis: Secondary | ICD-10-CM | POA: Diagnosis not present

## 2022-01-10 DIAGNOSIS — L570 Actinic keratosis: Secondary | ICD-10-CM | POA: Diagnosis not present

## 2022-01-10 DIAGNOSIS — L814 Other melanin hyperpigmentation: Secondary | ICD-10-CM

## 2022-01-10 DIAGNOSIS — Z808 Family history of malignant neoplasm of other organs or systems: Secondary | ICD-10-CM | POA: Diagnosis not present

## 2022-01-10 DIAGNOSIS — L578 Other skin changes due to chronic exposure to nonionizing radiation: Secondary | ICD-10-CM

## 2022-01-10 NOTE — Patient Instructions (Addendum)
Cryotherapy Aftercare  Wash gently with soap and water everyday.   Apply Vaseline daily until healed.    Recommend taking Heliocare sun protection supplement daily in sunny weather for additional sun protection. For maximum protection on the sunniest days, you can take up to 2 capsules of regular Heliocare OR take 1 capsule of Heliocare Ultra. For prolonged exposure (such as a full day in the sun), you can repeat your dose of the supplement 4 hours after your first dose. Heliocare can be purchased at [facility], at some [facility] or at www.[email or web address].     Recommend daily broad spectrum sunscreen SPF 30+ to sun-exposed areas, reapply every 2 hours as needed. Call for new or changing lesions.  Staying in the shade or wearing long sleeves, sun glasses (UVA+UVB protection) and wide brim hats (4-inch brim around the entire circumference of the hat) are also recommended for sun protection.      Melanoma ABCDEs  Melanoma is the most dangerous type of skin cancer, and is the leading cause of death from skin disease.  You are more likely to develop melanoma if you: Have light-colored skin, light-colored eyes, or red or blond hair Spend a lot of time in the sun Tan regularly, either outdoors or in a tanning bed Have had blistering sunburns, especially during childhood Have a close family member who has had a melanoma Have atypical moles or large birthmarks  Early detection of melanoma is key since treatment is typically straightforward and cure rates are extremely high if we catch it early.   The first sign of melanoma is often a change in a mole or a new dark spot.  The ABCDE system is a way of remembering the signs of melanoma.  A for asymmetry:  The two halves do not match. B for border:  The edges of the growth are irregular. C for color:  A mixture of colors are present instead of an even brown color. D for diameter:  Melanomas are usually (but not always) greater than 6mm -  the size of a pencil eraser. E for evolution:  The spot keeps changing in size, shape, and color.  Please check your skin once per month between visits. You can use a small mirror in front and a large mirror behind you to keep an eye on the back side or your body.   If you see any new or changing lesions before your next follow-up, please call to schedule a visit.  Please continue daily skin protection including broad spectrum sunscreen SPF 30+ to sun-exposed areas, reapplying every 2 hours as needed when you're outdoors.   Staying in the shade or wearing long sleeves, sun glasses (UVA+UVB protection) and wide brim hats (4-inch brim around the entire circumference of the hat) are also recommended for sun protection.     Due to recent changes in healthcare laws, you may see results of your pathology and/or laboratory studies on MyChart before the doctors have had a chance to review them. We understand that in some cases there may be results that are confusing or concerning to you. Please understand that not all results are received at the same time and often the doctors may need to interpret multiple results in order to provide you with the best plan of care or course of treatment. Therefore, we ask that you please give us 2 business days to thoroughly review all your results before contacting the office for clarification. Should we see a critical lab result, you will   be contacted sooner.   If You Need Anything After Your Visit  If you have any questions or concerns for your doctor, please call our main line at [phone] and press option 4 to reach your doctor's medical assistant. If no one answers, please leave a voicemail as directed and we will return your call as soon as possible. Messages left after 4 pm will be answered the following business day.   You may also send us a message via MyChart. We typically respond to MyChart messages within 1-2 business days.  For prescription refills,  please ask your pharmacy to contact our office. Our fax number is [phone].  If you have an urgent issue when the clinic is closed that cannot wait until the next business day, you can page your doctor at the number below.    Please note that while we do our best to be available for urgent issues outside of office hours, we are not available 24/7.   If you have an urgent issue and are unable to reach us, you may choose to seek medical care at your doctor's office, retail clinic, urgent care center, or emergency room.  If you have a medical emergency, please immediately call 911 or go to the emergency department.  Pager Numbers  - Dr. Kowalski: [phone]  - Dr. Moye: [phone]  - Dr. Stewart: [phone]  In the event of inclement weather, please call our main line at [phone] for an update on the status of any delays or closures.  Dermatology Medication Tips: Please keep the boxes that topical medications come in in order to help keep track of the instructions about where and how to use these. Pharmacies typically print the medication instructions only on the boxes and not directly on the medication tubes.   If your medication is too expensive, please contact our office at [phone] option 4 or send us a message through MyChart.   We are unable to tell what your co-pay for medications will be in advance as this is different depending on your insurance coverage. However, we may be able to find a substitute medication at lower cost or fill out paperwork to get insurance to cover a needed medication.   If a prior authorization is required to get your medication covered by your insurance company, please allow us 1-2 business days to complete this process.  Drug prices often vary depending on where the prescription is filled and some pharmacies may offer cheaper prices.  The website www.goodrx.com contains coupons for medications through different pharmacies. The prices  here do not account for what the cost may be with help from insurance (it may be cheaper with your insurance), but the website can give you the price if you did not use any insurance.  - You can print the associated coupon and take it with your prescription to the pharmacy.  - You may also stop by our office during regular business hours and pick up a GoodRx coupon card.  - If you need your prescription sent electronically to a different pharmacy, notify our office through [facility] MyChart or by phone at [phone] option 4.     Si Usted Necesita Algo Despus de Su Visita  Tambin puede enviarnos un mensaje a travs de MyChart. Por lo general respondemos a los mensajes de MyChart en el transcurso de 1 a 2 das hbiles.  Para renovar recetas, por favor pida a su farmacia que se ponga en contacto con nuestra oficina. Nuestro nmero de fax   es el [phone].  Si tiene un asunto urgente cuando la clnica est cerrada y que no puede esperar hasta el siguiente da hbil, puede llamar/localizar a su doctor(a) al nmero que aparece a continuacin.   Por favor, tenga en cuenta que aunque hacemos todo lo posible para estar disponibles para asuntos urgentes fuera del horario de oficina, no estamos disponibles las 24 horas del da, los 7 das de la semana.   Si tiene un problema urgente y no puede comunicarse con nosotros, puede optar por buscar atencin mdica  en el consultorio de su doctor(a), en una clnica privada, en un centro de atencin urgente o en una sala de emergencias.  Si tiene una emergencia mdica, por favor llame inmediatamente al 911 o vaya a la sala de emergencias.  Nmeros de bper  - Dr. Kowalski: [phone]  - Dra. Moye: [phone]  - Dra. Stewart: [phone]  En caso de inclemencias del tiempo, por favor llame a nuestra lnea principal al [phone] para una actualizacin sobre el estado de cualquier retraso o cierre.  Consejos para la medicacin en  dermatologa: Por favor, guarde las cajas en las que vienen los medicamentos de uso tpico para ayudarle a seguir las instrucciones sobre dnde y cmo usarlos. Las farmacias generalmente imprimen las instrucciones del medicamento slo en las cajas y no directamente en los tubos del medicamento.   Si su medicamento es muy caro, por favor, pngase en contacto con nuestra oficina llamando al [phone] y presione la opcin 4 o envenos un mensaje a travs de MyChart.   No podemos decirle cul ser su copago por los medicamentos por adelantado ya que esto es diferente dependiendo de la cobertura de su seguro. Sin embargo, es posible que podamos encontrar un medicamento sustituto a menor costo o llenar un formulario para que el seguro cubra el medicamento que se considera necesario.   Si se requiere una autorizacin previa para que su compaa de seguros cubra su medicamento, por favor permtanos de 1 a 2 das hbiles para completar este proceso.  Los precios de los medicamentos varan con frecuencia dependiendo del lugar de dnde se surte la receta y alguna farmacias pueden ofrecer precios ms baratos.  El sitio web www.goodrx.com tiene cupones para medicamentos de diferentes farmacias. Los precios aqu no tienen en cuenta lo que podra costar con la ayuda del seguro (puede ser ms barato con su seguro), pero el sitio web puede darle el precio si no utiliz ningn seguro.  - Puede imprimir el cupn correspondiente y llevarlo con su receta a la farmacia.  - Tambin puede pasar por nuestra oficina durante el horario de atencin regular y recoger una tarjeta de cupones de GoodRx.  - Si necesita que su receta se enve electrnicamente a una farmacia diferente, informe a nuestra oficina a travs de MyChart de  o por telfono llamando al [phone] y presione la opcin 4.  

## 2022-01-10 NOTE — Progress Notes (Signed)
Follow-Up Visit   Subjective  Chelsea Fitzgerald is a 76 y.o. female who presents for the following: Annual Exam (Hx of Aks. No personal Hx of skin cancer. Daughter has had 2 melanomas).  The patient presents for Total-Body Skin Exam (TBSE) for skin cancer screening and mole check.  The patient has spots, moles and lesions to be evaluated, some may be new or changing and the patient has concerns that these could be cancer.   The following portions of the chart were reviewed this encounter and updated as appropriate:  Tobacco  Allergies  Meds  Problems  Med Hx  Surg Hx  Fam Hx      Review of Systems: No other skin or systemic complaints except as noted in HPI or Assessment and Plan.   Objective  Well appearing patient in no apparent distress; mood and affect are within normal limits.  A full examination was performed including scalp, head, eyes, ears, nose, lips, neck, chest, axillae, abdomen, back, buttocks, bilateral upper extremities, bilateral lower extremities, hands, feet, fingers, toes, fingernails, and toenails. All findings within normal limits unless otherwise noted below.  Left Dorsal Hand x2, mid chest x2, R-3 toe x1, right medial thigh x1, right dorsal hand x3, right forearm x3, right upper arm x1, right nasal sidewall x1 (14) Erythematous thin papules/macules with gritty scale.    Assessment & Plan   Family history of skin cancer - what type(s): Melanoma x2 - who affected: Daughter   Lentigines - Scattered tan macules - Due to sun exposure - Benign-appearing, observe - Recommend daily broad spectrum sunscreen SPF 30+ to sun-exposed areas, reapply every 2 hours as needed. - Call for any changes  Seborrheic Keratoses - Stuck-on, waxy, tan-brown papules and/or plaques  - Benign-appearing - Discussed benign etiology and prognosis. - Observe - Call for any changes  Melanocytic Nevi - Tan-brown and/or pink-flesh-colored symmetric macules and papules - Benign  appearing on exam today - Observation - Call clinic for new or changing moles - Recommend daily use of broad spectrum spf 30+ sunscreen to sun-exposed areas.   Hemangiomas - Red papules - Discussed benign nature - Observe - Call for any changes  Actinic Damage - Chronic condition, secondary to cumulative UV/sun exposure - diffuse scaly erythematous macules with underlying dyspigmentation - Recommend daily broad spectrum sunscreen SPF 30+ to sun-exposed areas, reapply every 2 hours as needed.  - Staying in the shade or wearing long sleeves, sun glasses (UVA+UVB protection) and wide brim hats (4-inch brim around the entire circumference of the hat) are also recommended for sun protection.  - Call for new or changing lesions.  Skin cancer screening performed today.  AK (actinic keratosis) (14) Left Dorsal Hand x2, mid chest x2, R-3 toe x1, right medial thigh x1, right dorsal hand x3, right forearm x3, right upper arm x1, right nasal sidewall x1  Actinic keratoses are precancerous spots that appear secondary to cumulative UV radiation exposure/sun exposure over time. They are chronic with expected duration over 1 year. A portion of actinic keratoses will progress to squamous cell carcinoma of the skin. It is not possible to reliably predict which spots will progress to skin cancer and so treatment is recommended to prevent development of skin cancer.  Recommend daily broad spectrum sunscreen SPF 30+ to sun-exposed areas, reapply every 2 hours as needed.  Recommend staying in the shade or wearing long sleeves, sun glasses (UVA+UVB protection) and wide brim hats (4-inch brim around the entire circumference of the hat).  Call for new or changing lesions.  Destruction of lesion - Left Dorsal Hand x2, mid chest x2, R-3 toe x1, right medial thigh x1, right dorsal hand x3, right forearm x3, right upper arm x1, right nasal sidewall x1  Destruction method: cryotherapy   Informed consent: discussed  and consent obtained   Lesion destroyed using liquid nitrogen: Yes   Region frozen until ice ball extended beyond lesion: Yes   Outcome: patient tolerated procedure well with no complications   Post-procedure details: wound care instructions given   Additional details:  Prior to procedure, discussed risks of blister formation, small wound, skin dyspigmentation, or rare scar following cryotherapy. Recommend Vaseline ointment to treated areas while healing.    Return in about 1 year (around 01/11/2023) for TBSE.  I, Emelia Salisbury, CMA, am acting as scribe for Forest Gleason, MD.  Documentation: I have reviewed the above documentation for accuracy and completeness, and I agree with the above.  Forest Gleason, MD

## 2022-01-11 ENCOUNTER — Ambulatory Visit
Admission: RE | Admit: 2022-01-11 | Discharge: 2022-01-11 | Disposition: A | Discharge status: Home / Self Care | Payer: Medicare HMO | Source: Ambulatory Visit | Attending: Family Medicine | Admitting: Family Medicine

## 2022-01-11 ENCOUNTER — Encounter: Payer: Self-pay | Admitting: Dermatology

## 2022-01-11 DIAGNOSIS — Z1231 Encounter for screening mammogram for malignant neoplasm of breast: Secondary | ICD-10-CM | POA: Insufficient documentation

## 2022-02-05 ENCOUNTER — Other Ambulatory Visit: Payer: Self-pay | Admitting: Internal Medicine

## 2022-02-05 DIAGNOSIS — R0989 Other specified symptoms and signs involving the circulatory and respiratory systems: Secondary | ICD-10-CM

## 2022-02-07 ENCOUNTER — Other Ambulatory Visit: Payer: Self-pay

## 2022-02-07 ENCOUNTER — Inpatient Hospital Stay
Admission: EM | Admit: 2022-02-07 | Discharge: 2022-02-09 | DRG: 190 | Disposition: A | Discharge status: Home / Self Care | Payer: Medicare HMO | Attending: Obstetrics and Gynecology | Admitting: Obstetrics and Gynecology

## 2022-02-07 ENCOUNTER — Encounter: Payer: Self-pay | Admitting: Internal Medicine

## 2022-02-07 ENCOUNTER — Ambulatory Visit
Admission: RE | Admit: 2022-02-07 | Discharge: 2022-02-07 | Disposition: A | Discharge status: Home / Self Care | Payer: Medicare HMO | Source: Ambulatory Visit | Attending: Internal Medicine | Admitting: Internal Medicine

## 2022-02-07 DIAGNOSIS — J441 Chronic obstructive pulmonary disease with (acute) exacerbation: Principal | ICD-10-CM | POA: Diagnosis present

## 2022-02-07 DIAGNOSIS — I252 Old myocardial infarction: Secondary | ICD-10-CM | POA: Diagnosis not present

## 2022-02-07 DIAGNOSIS — Z9861 Coronary angioplasty status: Secondary | ICD-10-CM

## 2022-02-07 DIAGNOSIS — E876 Hypokalemia: Secondary | ICD-10-CM | POA: Diagnosis present

## 2022-02-07 DIAGNOSIS — R0989 Other specified symptoms and signs involving the circulatory and respiratory systems: Secondary | ICD-10-CM | POA: Insufficient documentation

## 2022-02-07 DIAGNOSIS — R7301 Impaired fasting glucose: Secondary | ICD-10-CM | POA: Diagnosis present

## 2022-02-07 DIAGNOSIS — Z96652 Presence of left artificial knee joint: Secondary | ICD-10-CM | POA: Diagnosis present

## 2022-02-07 DIAGNOSIS — J9601 Acute respiratory failure with hypoxia: Secondary | ICD-10-CM | POA: Diagnosis present

## 2022-02-07 DIAGNOSIS — Z79818 Long term (current) use of other agents affecting estrogen receptors and estrogen levels: Secondary | ICD-10-CM | POA: Diagnosis not present

## 2022-02-07 DIAGNOSIS — J9621 Acute and chronic respiratory failure with hypoxia: Secondary | ICD-10-CM | POA: Diagnosis present

## 2022-02-07 DIAGNOSIS — F411 Generalized anxiety disorder: Secondary | ICD-10-CM | POA: Diagnosis present

## 2022-02-07 DIAGNOSIS — I2510 Atherosclerotic heart disease of native coronary artery without angina pectoris: Secondary | ICD-10-CM | POA: Diagnosis present

## 2022-02-07 DIAGNOSIS — Z87891 Personal history of nicotine dependence: Secondary | ICD-10-CM | POA: Diagnosis not present

## 2022-02-07 DIAGNOSIS — I110 Hypertensive heart disease with heart failure: Secondary | ICD-10-CM | POA: Diagnosis present

## 2022-02-07 DIAGNOSIS — G629 Polyneuropathy, unspecified: Secondary | ICD-10-CM | POA: Diagnosis present

## 2022-02-07 DIAGNOSIS — I447 Left bundle-branch block, unspecified: Secondary | ICD-10-CM | POA: Diagnosis present

## 2022-02-07 DIAGNOSIS — E669 Obesity, unspecified: Secondary | ICD-10-CM | POA: Diagnosis present

## 2022-02-07 DIAGNOSIS — Z79899 Other long term (current) drug therapy: Secondary | ICD-10-CM | POA: Diagnosis not present

## 2022-02-07 DIAGNOSIS — Z888 Allergy status to other drugs, medicaments and biological substances status: Secondary | ICD-10-CM

## 2022-02-07 DIAGNOSIS — Z8616 Personal history of COVID-19: Secondary | ICD-10-CM | POA: Diagnosis not present

## 2022-02-07 DIAGNOSIS — E785 Hyperlipidemia, unspecified: Secondary | ICD-10-CM | POA: Diagnosis present

## 2022-02-07 DIAGNOSIS — I10 Essential (primary) hypertension: Secondary | ICD-10-CM | POA: Diagnosis present

## 2022-02-07 DIAGNOSIS — Z8249 Family history of ischemic heart disease and other diseases of the circulatory system: Secondary | ICD-10-CM

## 2022-02-07 DIAGNOSIS — I5022 Chronic systolic (congestive) heart failure: Secondary | ICD-10-CM | POA: Diagnosis present

## 2022-02-07 DIAGNOSIS — F32A Depression, unspecified: Secondary | ICD-10-CM | POA: Diagnosis present

## 2022-02-07 DIAGNOSIS — R319 Hematuria, unspecified: Secondary | ICD-10-CM | POA: Diagnosis present

## 2022-02-07 DIAGNOSIS — J439 Emphysema, unspecified: Secondary | ICD-10-CM | POA: Diagnosis present

## 2022-02-07 DIAGNOSIS — R9389 Abnormal findings on diagnostic imaging of other specified body structures: Secondary | ICD-10-CM | POA: Diagnosis present

## 2022-02-07 LAB — TROPONIN I (HIGH SENSITIVITY)
Troponin I (High Sensitivity): 24 ng/L — ABNORMAL HIGH (ref <18)
Troponin I (High Sensitivity): 27 ng/L — ABNORMAL HIGH (ref <18)

## 2022-02-07 LAB — CBC
HCT: 42.6 % (ref 36.0–46.0)
Hemoglobin: 13.8 g/dL (ref 12.0–15.0)
MCH: 29.4 pg (ref 26.0–34.0)
MCHC: 32.4 g/dL (ref 30.0–36.0)
MCV: 90.8 fL (ref 80.0–100.0)
Platelets: 219 10*3/uL (ref 150–400)
RBC: 4.69 MIL/uL (ref 3.87–5.11)
RDW: 14.5 % (ref 11.5–15.5)
WBC: 6.3 10*3/uL (ref 4.0–10.5)
nRBC: 0 % (ref 0.0–0.2)

## 2022-02-07 LAB — BASIC METABOLIC PANEL
Anion gap: 11 (ref 5–15)
BUN: 17 mg/dL (ref 8–23)
CO2: 22 mmol/L (ref 22–32)
Calcium: 9.2 mg/dL (ref 8.9–10.3)
Chloride: 103 mmol/L (ref 98–111)
Creatinine, Ser: 0.91 mg/dL (ref 0.44–1.00)
GFR, Estimated: >60 mL/min (ref >60)
Glucose, Bld: 95 mg/dL (ref 70–99)
Potassium: 3.5 mmol/L (ref 3.5–5.1)
Sodium: 136 mmol/L (ref 135–145)

## 2022-02-07 LAB — BLOOD GAS, VENOUS
Acid-Base Excess: 6 mmol/L — ABNORMAL HIGH (ref 0.0–2.0)
Bicarbonate: 30.6 mmol/L — ABNORMAL HIGH (ref 20.0–28.0)
O2 Saturation: 78.3 %
Patient temperature: 37
pCO2, Ven: 43 mmHg — ABNORMAL LOW (ref 44–60)
pH, Ven: 7.46 — ABNORMAL HIGH (ref 7.25–7.43)
pO2, Ven: 40 mmHg (ref 32–45)

## 2022-02-07 LAB — PROCALCITONIN: Procalcitonin: <0.1 ng/mL

## 2022-02-07 MED ORDER — ROSUVASTATIN CALCIUM 5 MG PO TABS
5.0000 mg | ORAL_TABLET | Freq: Every day | ORAL | Status: DC
  Administered 2022-02-08 – 2022-02-09 (×2): 5 mg via ORAL
  Filled 2022-02-07 (×2): qty 1

## 2022-02-07 MED ORDER — SODIUM CHLORIDE 0.9% FLUSH
3.0000 mL | Freq: Two times a day (BID) | INTRAVENOUS | Status: DC
  Administered 2022-02-07 – 2022-02-09 (×4): 3 mL via INTRAVENOUS

## 2022-02-07 MED ORDER — SODIUM CHLORIDE 0.9 % IV SOLN
500.0000 mg | INTRAVENOUS | Status: DC
  Filled 2022-02-07: qty 5

## 2022-02-07 MED ORDER — LOSARTAN POTASSIUM 50 MG PO TABS: 50.0000 mg | ORAL_TABLET | Freq: Every day | ORAL | Status: DC

## 2022-02-07 MED ORDER — ALBUTEROL SULFATE (2.5 MG/3ML) 0.083% IN NEBU
2.5000 mg | INHALATION_SOLUTION | Freq: Four times a day (QID) | RESPIRATORY_TRACT | Status: DC
  Administered 2022-02-07 – 2022-02-08 (×2): 2.5 mg via RESPIRATORY_TRACT
  Filled 2022-02-07 (×3): qty 3

## 2022-02-07 MED ORDER — CITALOPRAM HYDROBROMIDE 20 MG PO TABS
20.0000 mg | ORAL_TABLET | Freq: Every day | ORAL | Status: DC
  Administered 2022-02-08 – 2022-02-09 (×2): 20 mg via ORAL
  Filled 2022-02-07 (×2): qty 1

## 2022-02-07 MED ORDER — HYDROCHLOROTHIAZIDE 12.5 MG PO TABS: 12.5000 mg | ORAL_TABLET | Freq: Every day | ORAL | Status: DC

## 2022-02-07 MED ORDER — SODIUM CHLORIDE 0.9 % IV SOLN
500.0000 mg | Freq: Once | INTRAVENOUS | Status: AC
  Administered 2022-02-07: 500 mg via INTRAVENOUS
  Filled 2022-02-07 (×2): qty 5

## 2022-02-07 MED ORDER — IOHEXOL 350 MG/ML SOLN
75.0000 mL | Freq: Once | INTRAVENOUS | Status: AC | PRN
  Administered 2022-02-07: 75 mL via INTRAVENOUS

## 2022-02-07 MED ORDER — FLUTICASONE PROPIONATE 50 MCG/ACT NA SUSP
2.0000 | Freq: Every day | NASAL | Status: DC
  Administered 2022-02-08 – 2022-02-09 (×2): 2 via NASAL
  Filled 2022-02-07: qty 16

## 2022-02-07 MED ORDER — ACETAMINOPHEN 325 MG PO TABS: 650.0000 mg | ORAL_TABLET | Freq: Four times a day (QID) | ORAL | Status: DC | PRN

## 2022-02-07 MED ORDER — HEPARIN SODIUM (PORCINE) 5000 UNIT/ML IJ SOLN
5000.0000 [IU] | Freq: Two times a day (BID) | INTRAMUSCULAR | Status: DC
  Administered 2022-02-07: 5000 [IU] via SUBCUTANEOUS
  Filled 2022-02-07: qty 1

## 2022-02-07 MED ORDER — METOPROLOL SUCCINATE ER 50 MG PO TB24
50.0000 mg | ORAL_TABLET | Freq: Every day | ORAL | Status: DC
  Administered 2022-02-08 – 2022-02-09 (×2): 50 mg via ORAL
  Filled 2022-02-07 (×2): qty 1

## 2022-02-07 MED ORDER — SODIUM CHLORIDE 0.9 % IV SOLN
1.0000 g | Freq: Once | INTRAVENOUS | Status: AC
  Administered 2022-02-07: 1 g via INTRAVENOUS
  Filled 2022-02-07: qty 10

## 2022-02-07 MED ORDER — MORPHINE SULFATE (PF) 2 MG/ML IV SOLN: 2.0000 mg | INTRAVENOUS | Status: DC | PRN

## 2022-02-07 MED ORDER — LOSARTAN POTASSIUM-HCTZ 50-12.5 MG PO TABS: 1.0000 | ORAL_TABLET | Freq: Every day | ORAL | Status: DC

## 2022-02-07 MED ORDER — HYDROCODONE-ACETAMINOPHEN 5-325 MG PO TABS
1.0000 | ORAL_TABLET | ORAL | Status: DC | PRN
  Administered 2022-02-08 (×3): 1 via ORAL
  Filled 2022-02-07 (×3): qty 1

## 2022-02-07 MED ORDER — ACETAMINOPHEN 650 MG RE SUPP: 650.0000 mg | Freq: Four times a day (QID) | RECTAL | Status: DC | PRN

## 2022-02-07 MED ORDER — SODIUM CHLORIDE 0.9 % IV SOLN
2.0000 g | INTRAVENOUS | Status: DC
  Administered 2022-02-08: 2 g via INTRAVENOUS
  Filled 2022-02-07: qty 20

## 2022-02-07 MED ORDER — ALPRAZOLAM 0.25 MG PO TABS
0.2500 mg | ORAL_TABLET | Freq: Every day | ORAL | Status: DC
  Administered 2022-02-07 – 2022-02-08 (×2): 0.25 mg via ORAL
  Filled 2022-02-07 (×2): qty 1

## 2022-02-07 MED ORDER — MONTELUKAST SODIUM 10 MG PO TABS
10.0000 mg | ORAL_TABLET | Freq: Every day | ORAL | Status: DC
  Administered 2022-02-08: 10 mg via ORAL
  Filled 2022-02-07: qty 1

## 2022-02-07 NOTE — H&P (Signed)
History and Physical    Chief Complaint: Sob.   HISTORY OF PRESENT ILLNESS: Chelsea Fitzgerald is an 76 y.o. female  seen for SOB. Chart review shows patient has been visiting primary care since December 20, 2021 for her shortness of breath and dyspnea on exertion that is progressively gotten worse.  Her O2 sats were noted to be dropping in the 80s.  And 70s with exertion. Patient has noted to be short of breath since contracting COVID in August.  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 Satira Mccallum cardiology last echocardiogram in September 2022 showed reduced ejection fraction of 45%. Per PCP note patient was suspected of COPD and COVID and will start started on prednisone and inhalers.   Since September patient has been seen a total of 3 times by PCP for this problem of shortness of breath since contracting COVID in September, October, November. Patient had a trial of Lasix for suspected CHF. Patient was seen by Dr. Clayborn Bigness cardiology who ordered a CT angio chest for suspected pulmonary embolism.  Pt has PMH as below: Past Medical History:  Diagnosis Date   Anxiety    Arthritis    Carotid stenosis, bilateral    Coronary artery disease    Depression    Hyperlipidemia    Hypertension    LBBB (left bundle branch block) 12/28/2018   Noted on EKG   Migraines    Neuropathy    STEMI (ST elevation myocardial infarction) (HCC)    SUI (stress urinary incontinence, female)    Uterine prolapse    Varicose veins      Review of Systems  Constitutional:  Positive for fatigue.  Respiratory:  Positive for shortness of breath.   All other systems reviewed and are negative.     Allergies  Allergen Reactions   Corticosteroids Rash   Ace Inhibitors Other (See Comments)    Mouth ulcers   Lipitor [Atorvastatin] Diarrhea   Lisinopril  Itching   Simvastatin Diarrhea   Statins Diarrhea     Past Surgical History:  Procedure Laterality Date   BREAST BIOPSY Left 03/20/2016   FRAGMENT OF CYST WALL.   BREAST BIOPSY Right 05/29/2017   ribbon clip, benign   CAROTID STENT     CHOLECYSTECTOMY     COLONOSCOPY     COLONOSCOPY WITH PROPOFOL N/A 06/05/2021   Procedure: COLONOSCOPY WITH PROPOFOL;  Surgeon: Lin Landsman, MD;  Location: [facility] ENDOSCOPY;  Service: Gastroenterology;  Laterality: N/A;   COLONOSCOPY WITH PROPOFOL N/A 08/20/2021   Procedure: COLONOSCOPY WITH PROPOFOL;  Surgeon: Rush Landmark Telford Nab., MD;  Location: [facility] ENDOSCOPY;  Service: Gastroenterology;  Laterality: N/A;   CORONARY ANGIOPLASTY     ENDOSCOPIC MUCOSAL RESECTION N/A 08/20/2021   Procedure: ENDOSCOPIC MUCOSAL RESECTION;  Surgeon: Rush Landmark Telford Nab., MD;  Location: [facility] ENDOSCOPY;  Service: Gastroenterology;  Laterality: N/A;   HEMOSTASIS CLIP PLACEMENT  08/20/2021   Procedure: HEMOSTASIS CLIP PLACEMENT;  Surgeon: Irving Copas., MD;  Location: [facility];  Service: Gastroenterology;;   PARTIAL KNEE ARTHROPLASTY Left 01/18/2019   Procedure: Left knee medial unicompartmental arthroplasty;  Surgeon: Gaynelle Arabian, MD;  Location: [facility] ORS;  Service: Orthopedics;  Laterality: Left;  17mn   POLYPECTOMY  08/20/2021   Procedure: POLYPECTOMY;  Surgeon: Mansouraty, GTelford Nab, MD;  [facility];  Service: Gastroenterology;;   SHOULDER SURGERY  Right 10/2020   SUBMUCOSAL LIFTING INJECTION  08/20/2021   Procedure: SUBMUCOSAL LIFTING INJECTION;  Surgeon: Irving Copas., MD;  Location: [facility];  Service: Gastroenterology;;   UMBILICAL HERNIA REPAIR        Social History   Socioeconomic History   Marital status: Married    Spouse name: Not on file   Number of children: 2   Years of education: Not on file   Highest education level: Not on file  Occupational History   Not on file  Tobacco Use   Smoking status: Former     Packs/day: 1.00    Types: Cigarettes    Quit date: 01/22/2012    Years since quitting: 10.0   Smokeless tobacco: Never   Tobacco comments:    47   Vaping Use   Vaping Use: Never used  Substance and Sexual Activity   Alcohol use: Yes    Comment: occas   Drug use: Never   Sexual activity: Not Currently  Other Topics Concern   Not on file  Social History Narrative   Not on file   Social Determinants of Health   Financial Resource Strain: Not on file  Food Insecurity: Not on file  Transportation Needs: Not on file  Physical Activity: Not on file  Stress: Not on file  Social Connections: Not on file      CURRENT MEDS:    Current Facility-Administered Medications (Cardiovascular):    hydrochlorothiazide (HYDRODIURIL) tablet 12.5 mg   losartan (COZAAR) tablet 50 mg   metoprolol succinate (TOPROL-XL) 24 hr tablet 50 mg   rosuvastatin (CRESTOR) tablet 5 mg  Current Outpatient Medications (Cardiovascular):    furosemide (LASIX) 20 MG tablet, Take 20 mg by mouth daily.   losartan-hydrochlorothiazide (HYZAAR) 50-12.5 MG tablet, Take 1 tablet by mouth daily.    metoprolol succinate (TOPROL-XL) 50 MG 24 hr tablet, Take 50 mg by mouth daily.    rosuvastatin (CRESTOR) 5 MG tablet, Take 5 mg by mouth daily.   sacubitril-valsartan (ENTRESTO) 24-26 MG, Take 1 tablet by mouth 2 (two) times daily.   spironolactone (ALDACTONE) 25 MG tablet, Take 12.5 mg by mouth 2 (two) times daily.  Current Facility-Administered Medications (Respiratory):    albuterol (PROVENTIL) (2.5 MG/3ML) 0.083% nebulizer solution 2.5 mg   fluticasone (FLONASE) 50 MCG/ACT nasal spray 2 spray   montelukast (SINGULAIR) tablet 10 mg  Current Outpatient Medications (Respiratory):    fluticasone (FLONASE) 50 MCG/ACT nasal spray, Place 2 sprays into both nostrils daily.    loratadine (CLARITIN) 10 MG tablet, Take 10 mg by mouth daily as needed for allergies.    montelukast (SINGULAIR) 10 MG tablet, Take 10 mg by  mouth at bedtime.  Current Facility-Administered Medications (Analgesics):    acetaminophen (TYLENOL) tablet 650 mg **OR** acetaminophen (TYLENOL) suppository 650 mg   HYDROcodone-acetaminophen (NORCO/VICODIN) 5-325 MG per tablet 1 tablet   morphine (PF) 2 MG/ML injection 2 mg   Current Facility-Administered Medications (Hematological):    heparin injection 5,000 Units   Current Facility-Administered Medications (Other):    ALPRAZolam (XANAX) tablet 0.25 mg   azithromycin (ZITHROMAX) 500 mg in sodium chloride 0.9 % 250 mL IVPB   cefTRIAXone (ROCEPHIN) 2 g in sodium chloride 0.9 % 100 mL IVPB   citalopram (CELEXA) tablet 20 mg   gabapentin (NEURONTIN) capsule 300-600 mg   Melatonin TBDP 3 mg   sodium chloride flush (NS) 0.9 % injection 3 mL  Current Outpatient Medications (Other):    ALPRAZolam (XANAX) 0.5 MG tablet, Take 0.25 mg  by mouth at bedtime.    citalopram (CELEXA) 20 MG tablet, Take 20 mg by mouth daily.    gabapentin (NEURONTIN) 300 MG capsule, Take 300-600 mg by mouth See admin instructions. 300 mg in morning, 300 mg in afternoon and 600 mg  at night   Melatonin 3 MG TBDP, Take 3 mg by mouth at bedtime.   MISCELLANEOUS VAGINAL PRODUCTS VA, Place 1 application  vaginally at bedtime. INSERT 4 CLICKS (= 1ML) VAGINALLY AT BEDTIME TWO TIMES PER WEEK -REFILLS 48HR NOTICE - CUSTOM COMPOUND   Polyethyl Glycol-Propyl Glycol (SYSTANE) 0.4-0.3 % SOLN, Place 1 drop into both eyes at bedtime.   estradiol (ESTRACE) 0.1 MG/GM vaginal cream, Place vaginally 2 (two) times a week. (Patient not taking: Reported on 02/07/2022)    ED Course: Pt in Ed alert awake afebrile O2 sats of 99% on 3 L nasal cannula. Vitals:   02/07/22 1716 02/07/22 1930 02/07/22 2100 02/07/22 2246  BP: (!) 147/99 138/89 107/75 125/84  Pulse: 65 71 73 68  Resp: '17 13 16 20  '$ Temp: 98 F (36.7 C)  97.9 F (36.6 C)   TempSrc:   Oral   SpO2: 99%  95% 94%  Weight:      Height:       No intake/output data  recorded. SpO2: 94 % O2 Flow Rate (L/min): 3 L/min Blood work in ed shows: Mild troponin elevation at 24 and 27. Normal BMP. ABG today shows respiratory alkalosis with a pH of 7.46 PCO2 43 PO2 of 40 bicarb of 30.6. CBC is within normal limits. > CT angio chest PE protocol is abnormal and showing: IMPRESSION: 1. Negative examination for pulmonary embolism. 2. Somewhat nodular, consolidative airspace opacity of the inferior right upper lobe abutting the minor fissure, measuring 2.4 x 1.9 cm, with adjacent ground-glass. Although most likely infectious or inflammatory, malignancy is not excluded. Recommend follow-up CT in 3 months to ensure resolution. 3. Additional incidental 0.6 cm nodule of the anterior right upper lobe, also possibly infectious or inflammatory. Attention on follow-up at the time of above recommended CT. 4. Cardiomegaly and coronary artery disease. 5. Gross enlargement of the main pulmonary artery, as can be seen in pulmonary hypertension. 6. Small pericardial effusion. 7. Emphysema. 8. Coarse contour of the liver, suggestive of cirrhosis. Correlate with biochemical findings. 9. Left nephrolithiasis.    Results for orders placed or performed during the hospital encounter of 02/07/22 (from the past 48 hour(s))  Basic metabolic panel     Status: None   Collection Time: 02/07/22  2:34 PM  Result Value Ref Range   Sodium 136 135 - 145 mmol/L   Potassium 3.5 3.5 - 5.1 mmol/L   Chloride 103 98 - 111 mmol/L   CO2 22 22 - 32 mmol/L   Glucose, Bld 95 70 - 99 mg/dL    Comment: Glucose reference range applies only to samples taken after fasting for at least 8 hours.   BUN 17 8 - 23 mg/dL   Creatinine, Ser 0.91 0.44 - 1.00 mg/dL   Calcium 9.2 8.9 - 10.3 mg/dL   GFR, Estimated >60 >60 mL/min    Comment: (NOTE) Calculated using the CKD-EPI Creatinine Equation (2021)    Anion gap 11 5 - 15    Comment: Performed at [facility], [address]  [place], [place] [address]  CBC     Status: None   Collection Time: 02/07/22  2:34 PM  Result Value Ref Range   WBC 6.3 4.0 - 10.5  K/uL   RBC 4.69 3.87 - 5.11 MIL/uL   Hemoglobin 13.8 12.0 - 15.0 g/dL   HCT 42.6 36.0 - 46.0 %   MCV 90.8 80.0 - 100.0 fL   MCH 29.4 26.0 - 34.0 pg   MCHC 32.4 30.0 - 36.0 g/dL   RDW 14.5 11.5 - 15.5 %   Platelets 219 150 - 400 K/uL   nRBC 0.0 0.0 - 0.2 %    Comment: Performed at [facility], [place], [place] [address]  Troponin I (High Sensitivity)     Status: Abnormal   Collection Time: 02/07/22  2:34 PM  Result Value Ref Range   Troponin I (High Sensitivity) 24 (H) <18 ng/L    Comment: (NOTE) Elevated high sensitivity troponin I (hsTnI) values and significant  changes across serial measurements may suggest ACS but many other  chronic and acute conditions are known to elevate hsTnI results.  Refer to the "Links" section for chest pain algorithms and additional  guidance. Performed at [facility], [place], [place] [address]   Troponin I (High Sensitivity)     Status: Abnormal   Collection Time: 02/07/22  5:17 PM  Result Value Ref Range   Troponin I (High Sensitivity) 27 (H) <18 ng/L    Comment: (NOTE) Elevated high sensitivity troponin I (hsTnI) values and significant  changes across serial measurements may suggest ACS but many other  chronic and acute conditions are known to elevate hsTnI results.  Refer to the "Links" section for chest pain algorithms and additional  guidance. Performed at [facility], [address] [place],  [address]   Procalcitonin - Baseline     Status: None   Collection Time: 02/07/22  5:17 PM  Result Value Ref Range   Procalcitonin <0.10 ng/mL    Comment:        Interpretation: PCT (Procalcitonin) <= 0.5 ng/mL: Systemic infection (sepsis) is not likely. Local bacterial infection is possible. (NOTE)       Sepsis PCT Algorithm           Lower  Respiratory Tract                                      Infection PCT Algorithm    ----------------------------     ----------------------------         PCT < 0.25 ng/mL                PCT < 0.10 ng/mL          Strongly encourage             Strongly discourage   discontinuation of antibiotics    initiation of antibiotics    ----------------------------     -----------------------------       PCT 0.25 - 0.50 ng/mL            PCT 0.10 - 0.25 ng/mL               OR       >80% decrease in PCT            Discourage initiation of                                            antibiotics  Encourage discontinuation           of antibiotics    ----------------------------     -----------------------------         PCT >= 0.50 ng/mL              PCT 0.26 - 0.50 ng/mL               AND        <80% decrease in PCT             Encourage initiation of                                             antibiotics       Encourage continuation           of antibiotics    ----------------------------     -----------------------------        PCT >= 0.50 ng/mL                  PCT > 0.50 ng/mL               AND         increase in PCT                  Strongly encourage                                      initiation of antibiotics    Strongly encourage escalation           of antibiotics                                     -----------------------------                                           PCT <= 0.25 ng/mL                                                 OR                                        > 80% decrease in PCT                                      Discontinue / Do not initiate                                             antibiotics  Performed at [facility], [address] [place], [place] [address]   Blood gas, venous     Status: Abnormal   Collection Time: 02/07/22  6:45 PM  Result  Value Ref Range   pH, Ven 7.46 (H) 7.25 - 7.43   pCO2, Ven 43 (L) 44 - 60 mmHg   pO2, Ven 40  32 - 45 mmHg   Bicarbonate 30.6 (H) 20.0 - 28.0 mmol/L   Acid-Base Excess 6.0 (H) 0.0 - 2.0 mmol/L   O2 Saturation 78.3 %   Patient temperature 37.0    Collection site VEIN     Comment: Performed at [facility], [address] [place], [place] [address]    In Ed pt received  Meds ordered this encounter  Medications   cefTRIAXone (ROCEPHIN) 1 g in sodium chloride 0.9 % 100 mL IVPB    Order Specific Question:   Antibiotic Indication:    Answer:   CAP   azithromycin (ZITHROMAX) 500 mg in sodium chloride 0.9 % 250 mL IVPB   cefTRIAXone (ROCEPHIN) 2 g in sodium chloride 0.9 % 100 mL IVPB    Order Specific Question:   Antibiotic Indication:    Answer:   CAP   azithromycin (ZITHROMAX) 500 mg in sodium chloride 0.9 % 250 mL IVPB    Order Specific Question:   Antibiotic Indication:    Answer:   CAP   ALPRAZolam (XANAX) tablet 0.25 mg   citalopram (CELEXA) tablet 20 mg   fluticasone (FLONASE) 50 MCG/ACT nasal spray 2 spray   DISCONTD: losartan-hydrochlorothiazide (HYZAAR) 50-12.5 MG per tablet 1 tablet   rosuvastatin (CRESTOR) tablet 5 mg   montelukast (SINGULAIR) tablet 10 mg   metoprolol succinate (TOPROL-XL) 24 hr tablet 50 mg   heparin injection 5,000 Units   sodium chloride flush (NS) 0.9 % injection 3 mL   OR Linked Order Group    acetaminophen (TYLENOL) tablet 650 mg    acetaminophen (TYLENOL) suppository 650 mg   HYDROcodone-acetaminophen (NORCO/VICODIN) 5-325 MG per tablet 1 tablet   morphine (PF) 2 MG/ML injection 2 mg   albuterol (PROVENTIL) (2.5 MG/3ML) 0.083% nebulizer solution 2.5 mg   losartan (COZAAR) tablet 50 mg   hydrochlorothiazide (HYDRODIURIL) tablet 12.5 mg   gabapentin (NEURONTIN) capsule 300-600 mg    300 mg in morning, 300 mg in afternoon and 600 mg  at night     Melatonin TBDP 3 mg    Unresulted Labs (From admission, onward)     Start     Ordered   02/08/22 0500  Comprehensive metabolic panel  Tomorrow morning,   STAT        02/07/22 1927    02/08/22 0500  CBC  Tomorrow morning,   STAT        02/07/22 1927             Admission Imaging : CT Angio Chest Pulmonary Embolism (PE) W or WO Contrast  Result Date: 02/07/2022 CLINICAL DATA:  Shortness of breath, cardiomegaly EXAM: CT ANGIOGRAPHY CHEST WITH CONTRAST TECHNIQUE: Multidetector CT imaging of the chest was performed using the standard protocol during bolus administration of intravenous contrast. Multiplanar CT image reconstructions and MIPs were obtained to evaluate the vascular anatomy. RADIATION DOSE REDUCTION: This exam was performed according to the departmental dose-optimization program which includes automated exposure control, adjustment of the mA and/or kV according to patient size and/or use of iterative reconstruction technique. CONTRAST:  62m OMNIPAQUE IOHEXOL 350 MG/ML SOLN COMPARISON:  None Available. FINDINGS: Cardiovascular: Satisfactory opacification of the pulmonary arteries to the segmental level. No evidence of pulmonary embolism. Cardiomegaly. Three-vessel coronary artery calcifications. Gross enlargement of the main pulmonary artery measuring up to 4.6 cm in caliber. Small  pericardial effusion. Aortic atherosclerosis. 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 Mediastinum/Nodes: No enlarged mediastinal, hilar, or axillary lymph nodes. Thyroid gland, trachea, and esophagus demonstrate no significant findings. Lungs/Pleura: Moderate centrilobular and paraseptal emphysema. Somewhat nodular, consolidative airspace opacity of the inferior right upper lobe abutting the minor fissure, measuring 2.4 x 1.9 cm, with adjacent ground-glass (series 7, image 64). 0.6 cm nodule of the anterior right upper lobe (series 7, image 51). No pleural effusion or pneumothorax. Upper Abdomen: No acute abnormality. Coarse contour of the liver. Partially imaged superior pole left renal calculus. Musculoskeletal: No chest wall  abnormality. No acute osseous findings. Review of the MIP images confirms the above findings. IMPRESSION: 1. Negative examination for pulmonary embolism. 2. Somewhat nodular, consolidative airspace opacity of the inferior right upper lobe abutting the minor fissure, measuring 2.4 x 1.9 cm, with adjacent ground-glass. Although most likely infectious or inflammatory, malignancy is not excluded. Recommend follow-up CT in 3 months to ensure resolution. 3. Additional incidental 0.6 cm nodule of the anterior right upper lobe, also possibly infectious or inflammatory. Attention on follow-up at the time of above recommended CT. 4. Cardiomegaly and coronary artery disease. 5. Gross enlargement of the main pulmonary artery, as can be seen in pulmonary hypertension. 6. Small pericardial effusion. 7. Emphysema. 8. Coarse contour of the liver, suggestive of cirrhosis. Correlate with biochemical findings. 9. Left nephrolithiasis. Aortic Atherosclerosis (ICD10-I70.0) and Emphysema (ICD10-J43.9). Electronically Signed   By: Delanna Ahmadi M.D.   On: 02/07/2022 14:32     Physical Examination: Vitals:   02/07/22 1716 02/07/22 1930 02/07/22 2100 02/07/22 2246  BP: (!) 147/99 138/89 107/75 125/84  Pulse: 65 71 73 68  Temp: 98 F (36.7 C)  97.9 F (36.6 C)   Resp: '17 13 16 20  '$ Height:      Weight:      SpO2: 99%  95% 94%  TempSrc:   Oral   BMI (Calculated):       Physical Exam Vitals and nursing note reviewed.  Constitutional:      General: She is not in acute distress.    Appearance: Normal appearance. She is not ill-appearing, toxic-appearing or diaphoretic.  HENT:     Head: Normocephalic and atraumatic.     Right Ear: Hearing and external ear normal.     Left Ear: Hearing and external ear normal.     Nose: Nose normal. No nasal deformity.     Mouth/Throat:     Lips: Pink.     Mouth: Mucous membranes are moist.     Tongue: No lesions.  Eyes:     Extraocular Movements: Extraocular movements intact.      Pupils: Pupils are equal, round, and reactive to light.  Neck:     Vascular: No carotid bruit.  Cardiovascular:     Rate and Rhythm: Normal rate and regular rhythm.     Pulses: Normal pulses.     Heart sounds: Normal heart sounds.  Pulmonary:     Effort: Pulmonary effort is normal.     Breath sounds: Rhonchi and rales present.  Abdominal:     General: Bowel sounds are normal. There is no distension.     Palpations: Abdomen is soft. There is no mass.     Tenderness: There is no abdominal tenderness. There is no guarding.     Hernia: No hernia is present.  Musculoskeletal:     Right lower leg: No edema.     Left lower leg:  No edema.  Skin:    General: Skin is warm.  Neurological:     General: No focal deficit present.     Mental Status: She is alert and oriented to person, place, and time.     Cranial Nerves: Cranial nerves 2-12 are intact.     Motor: Motor function is intact.  Psychiatric:        Attention and Perception: Attention normal.        Mood and Affect: Mood normal.        Speech: Speech normal.        Behavior: Behavior normal. Behavior is cooperative.        Cognition and Memory: Cognition normal.     Assessment and Plan: * Acute respiratory failure with hypoxia (HCC) Pt has ct scan that is concerning for possible infection, although not apparent clinically.  We will get procalcitonin. Pulmonary consult requested for further eval. 2 D Echo. HR CT chest.  Supplemental oxygen.  Secure chat message to dr dr Patsey Berthold who is on call for pulmonary . SpO2: 99 % O2 Flow Rate (L/min): 3 L/min Vitals:   02/07/22 1423 02/07/22 1716  BP: 116/84 (!) 147/99  Pulse: 83 65  Temp: 98 F (36.7 C) 98 F (36.7 C)  Resp: 18 17  Height: '5\' 1"'$  (1.549 m)   Weight: 75.3 kg   SpO2: 95% 99%  TempSrc: Oral   BMI (Calculated): 31.38       Abnormal CT scan, chest ? If infectious of incidental.  Pulmonary consult for new resp failure with abnormal ct.    Impaired fasting  glucose We will follow BGT. A1c as needed.   Hematuria Patient has a history of hematuria we will repeat urinalysis. 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 [facility] urogynecology referral in 03/2021.   Generalized anxiety disorder Continue patient on her Celexa and alprazolam.   CAD (coronary artery disease) Continue patient on rosuvastatin metoprolol. Mild troponin elevation no chest pain or any and all symptoms.  We will follow EKG is abnormal and unchanged from prior .   Essential hypertension Vitals:   02/07/22 1423 02/07/22 1716  BP: 116/84 (!) 147/99  We will continue same dose Hyzaar if blood pressure varies with low blood pressure and high pressure will separate medications and titrate dose further.    DVT prophylaxis:  Heparin    Code Status:  Full    Family Communication:  Sunday,Barry E (Spouse)  [phone]   Disposition Plan:  Home    Consults called:  Pulmonary- Dr. Patsey Berthold  Admission status: Inpatient    Unit/ Expected LOS: Med tele/ 2-3 days.    Para Skeans MD [facility]  6 PM- 2 AM. Please contact me via secure Chat 6 PM-2 AM. [phone] ( Pager ) To contact the [facility] Attending or Consulting provider [place] or covering provider during after hours [place], for this patient.   Check the care team in [facility] and look for a) attending/consulting [facility] provider listed and b) the [facility] team listed Log into [email or web address] and use [place]'s universal password to access. If you do not have the password, please contact the hospital operator. Locate the [facility] provider you are looking for under [facility] and page to a number that you can be directly reached. If you still have difficulty reaching the provider, please page the [facility] (Director on Call) for the Hospitalists listed on amion for assistance. [email or web address]  02/08/2022, 1:28 AM

## 2022-02-07 NOTE — Assessment & Plan Note (Addendum)
Pt has ct scan that is concerning for possible infection, although not apparent clinically.  We will get procalcitonin. Pulmonary consult requested for further eval. 2 D Echo. HR CT chest.  Supplemental oxygen.  Secure chat message to dr dr Patsey Berthold who is on call for pulmonary . SpO2: 99 % O2 Flow Rate (L/min): 3 L/min Vitals:   02/07/22 1423 02/07/22 1716  BP: 116/84 (!) 147/99  Pulse: 83 65  Temp: 98 F (36.7 C) 98 F (36.7 C)  Resp: 18 17  Height: '5\' 1"'$  (1.549 m)   Weight: 75.3 kg   SpO2: 95% 99%  TempSrc: Oral   BMI (Calculated): 31.38

## 2022-02-07 NOTE — ED Triage Notes (Signed)
Pt reports that for the last month she has had shortness of breath and waking up with shortness of breath. Pt reports oxygen being as low at 50% on room air. Pt reports not being on oxygen. Pt reports an ache to the left chest, with history of MI and state placement.  While here in triage, patients oxygen was in the low 80's on room air, but with oxygen at 3lpm, increased to 96%

## 2022-02-07 NOTE — Assessment & Plan Note (Signed)
?   If infectious of incidental.  Pulmonary consult for new resp failure with abnormal ct.

## 2022-02-07 NOTE — Progress Notes (Signed)
Pt noted to have labored breathing upon entering CT room. Pulse ox was 94% with pt lying on exam table. Pt then ambulated with staff and found oxygen to be 75%. Pt instructed to go to the ED for evaluation and treatment.

## 2022-02-07 NOTE — ED Notes (Signed)
First nurse made aware of patients new oxygen requirements. Pt seen by PA in triage

## 2022-02-07 NOTE — ED Notes (Signed)
Notified Dr. Posey Pronto that patient became hot and flushed after starting azithromycin. Stopped the medication at 2018 and flushed patient's line.

## 2022-02-07 NOTE — ED Provider Triage Note (Signed)
Emergency Medicine Provider Triage Evaluation Note  Chelsea Fitzgerald , a 76 y.o. female  was evaluated in triage.  Pt complains of shortness of breath for over a month. She had a CTA today and oxygen was noted to be 70% on room air. She is not chronically on oxygen.  Physical Exam  There were no vitals taken for this visit. Gen:   Awake, no distress   Resp:  Normal effort. Speaking in full sentences.  MSK:   Moves extremities without difficulty  Other:    Medical Decision Making  Medically screening exam initiated at 2:23 PM.  Appropriate orders placed.  Chelsea Fitzgerald was informed that the remainder of the evaluation will be completed by another provider, this initial triage assessment does not replace that evaluation, and the importance of remaining in the ED until their evaluation is complete.    Victorino Dike, [place] 02/07/22 1437

## 2022-02-07 NOTE — ED Provider Notes (Signed)
[facility] Provider Note   Event Date/Time   First MD Initiated Contact with Patient 02/07/22 1737     (approximate) History  Shortness of Breath  HPI DELENN AHN is a 76 y.o. female with a past medical history of hypertension, CAD, and anxiety who presents for worsening shortness of breath over the last month since she had COVID.  Patient states that she has been taking her oxygen at home and has been as low as "50% on room air".  Patient was found in triage to have oxygen to the low 80s on room air was placed on 3 L nasal cannula.  Patient does endorse dyspnea on exertion that is been worsening over the last month as well. ROS: Patient currently denies any vision changes, tinnitus, difficulty speaking, facial droop, sore throat, chest pain, abdominal pain, nausea/vomiting/diarrhea, dysuria, or weakness/numbness/paresthesias in any extremity   Physical Exam  Triage Vital Signs: ED Triage Vitals  Enc Vitals Group     BP 02/07/22 1423 116/84     Pulse Rate 02/07/22 1423 83     Resp 02/07/22 1423 18     Temp 02/07/22 1423 98 F (36.7 C)     Temp Source 02/07/22 1423 Oral     SpO2 02/07/22 1423 95 %     Weight 02/07/22 1423 166 lb (75.3 kg)     Height 02/07/22 1423 '5\' 1"'$  (1.549 m)     Head Circumference --      Peak Flow --      Pain Score 02/07/22 1429 0     Pain Loc --      Pain Edu? --      Excl. in [place]? --    Most recent vital signs: Vitals:   02/07/22 2100 02/07/22 2246  BP: 107/75 125/84  Pulse: 73 68  Resp: 16 20  Temp: 97.9 F (36.6 C)   SpO2: 95% 94%   General: Awake, oriented x4. CV:  Good peripheral perfusion.  Resp:  Normal effort.  Abd:  No distention.  Other:  Elderly obese Caucasian female laying in bed in no acute distress with 3 L nasal cannula in place ED Results / Procedures / Treatments  Labs (all labs ordered are listed, but only abnormal results are displayed) Labs Reviewed  BLOOD GAS, VENOUS - Abnormal; Notable for the  following components:      Result Value   pH, Ven 7.46 (*)    pCO2, Ven 43 (*)    Bicarbonate 30.6 (*)    Acid-Base Excess 6.0 (*)    All other components within normal limits  TROPONIN I (HIGH SENSITIVITY) - Abnormal; Notable for the following components:   Troponin I (High Sensitivity) 24 (*)    All other components within normal limits  TROPONIN I (HIGH SENSITIVITY) - Abnormal; Notable for the following components:   Troponin I (High Sensitivity) 27 (*)    All other components within normal limits  BASIC METABOLIC PANEL  CBC  PROCALCITONIN  COMPREHENSIVE METABOLIC PANEL  CBC   EKG ED ECG REPORT I, Naaman Plummer, the attending physician, personally viewed and interpreted this ECG. Date: 02/07/2022 EKG Time: 1430 Rate: 84 Rhythm: normal sinus rhythm QRS Axis: normal Intervals: LBBB ST/T Wave abnormalities: normal Narrative Interpretation: no evidence of acute ischemia RADIOLOGY ED MD interpretation: CT angiography of the chest interpreted by me and shows nodular consolidative airspace opacity in the inferior right upper lobe abutting the minor fissure.  There is also evidence of cardiomegaly  and distention of the pulmonary artery concerning for pulmonary hypertension -Agree with radiology assessment Official radiology report(s): CT Angio Chest Pulmonary Embolism (PE) W or WO Contrast  Result Date: 02/07/2022 CLINICAL DATA:  Shortness of breath, cardiomegaly EXAM: CT ANGIOGRAPHY CHEST WITH CONTRAST TECHNIQUE: Multidetector CT imaging of the chest was performed using the standard protocol during bolus administration of intravenous contrast. Multiplanar CT image reconstructions and MIPs were obtained to evaluate the vascular anatomy. RADIATION DOSE REDUCTION: This exam was performed according to the departmental dose-optimization program which includes automated exposure control, adjustment of the mA and/or kV according to patient size and/or use of iterative reconstruction  technique. CONTRAST:  68m OMNIPAQUE IOHEXOL 350 MG/ML SOLN COMPARISON:  None Available. FINDINGS: Cardiovascular: Satisfactory opacification of the pulmonary arteries to the segmental level. No evidence of pulmonary embolism. Cardiomegaly. Three-vessel coronary artery calcifications. Gross enlargement of the main pulmonary artery measuring up to 4.6 cm in caliber. Small pericardial effusion. Aortic atherosclerosis. 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 Mediastinum/Nodes: No enlarged mediastinal, hilar, or axillary lymph nodes. Thyroid gland, trachea, and esophagus demonstrate no significant findings. Lungs/Pleura: Moderate centrilobular and paraseptal emphysema. Somewhat nodular, consolidative airspace opacity of the inferior right upper lobe abutting the minor fissure, measuring 2.4 x 1.9 cm, with adjacent ground-glass (series 7, image 64). 0.6 cm nodule of the anterior right upper lobe (series 7, image 51). No pleural effusion or pneumothorax. Upper Abdomen: No acute abnormality. Coarse contour of the liver. Partially imaged superior pole left renal calculus. Musculoskeletal: No chest wall abnormality. No acute osseous findings. Review of the MIP images confirms the above findings. IMPRESSION: 1. Negative examination for pulmonary embolism. 2. Somewhat nodular, consolidative airspace opacity of the inferior right upper lobe abutting the minor fissure, measuring 2.4 x 1.9 cm, with adjacent ground-glass. Although most likely infectious or inflammatory, malignancy is not excluded. Recommend follow-up CT in 3 months to ensure resolution. 3. Additional incidental 0.6 cm nodule of the anterior right upper lobe, also possibly infectious or inflammatory. Attention on follow-up at the time of above recommended CT. 4. Cardiomegaly and coronary artery disease. 5. Gross enlargement of the main pulmonary artery, as can be seen in pulmonary hypertension. 6. Small  pericardial effusion. 7. Emphysema. 8. Coarse contour of the liver, suggestive of cirrhosis. Correlate with biochemical findings. 9. Left nephrolithiasis. Aortic Atherosclerosis (ICD10-I70.0) and Emphysema (ICD10-J43.9). Electronically Signed   By: ADelanna AhmadiM.D.   On: 02/07/2022 14:32   PROCEDURES: Critical Care performed: Yes, see critical care procedure note(s) .1-3 Lead EKG Interpretation  Performed by: BNaaman Plummer MD Authorized by: BNaaman Plummer MD     Interpretation: abnormal     ECG rate:  69   ECG rate assessment: normal     Rhythm: sinus rhythm     Ectopy: none     Conduction: normal   Comments:     LBBB CRITICAL CARE Performed by: ENaaman Plummer Total critical care time: 31 minutes  Critical care time was exclusive of separately billable procedures and treating other patients.  Critical care was necessary to treat or prevent imminent or life-threatening deterioration.  Critical care was time spent personally by me on the following activities: development of treatment plan with patient and/or surrogate as well as nursing, discussions with consultants, evaluation of patient's response to treatment, examination of patient, obtaining history from patient or surrogate, ordering and performing treatments and interventions, ordering and review of laboratory studies, ordering and review of radiographic studies,  pulse oximetry and re-evaluation of patient's condition.  MEDICATIONS ORDERED IN ED: Medications  cefTRIAXone (ROCEPHIN) 2 g in sodium chloride 0.9 % 100 mL IVPB (has no administration in time range)  azithromycin (ZITHROMAX) 500 mg in sodium chloride 0.9 % 250 mL IVPB (has no administration in time range)  ALPRAZolam (XANAX) tablet 0.25 mg (0.25 mg Oral Given 02/07/22 2301)  citalopram (CELEXA) tablet 20 mg (has no administration in time range)  fluticasone (FLONASE) 50 MCG/ACT nasal spray 2 spray (has no administration in time range)  rosuvastatin (CRESTOR)  tablet 5 mg (has no administration in time range)  montelukast (SINGULAIR) tablet 10 mg (has no administration in time range)  metoprolol succinate (TOPROL-XL) 24 hr tablet 50 mg (has no administration in time range)  heparin injection 5,000 Units (5,000 Units Subcutaneous Given 02/07/22 2301)  sodium chloride flush (NS) 0.9 % injection 3 mL (3 mLs Intravenous Given 02/07/22 2305)  acetaminophen (TYLENOL) tablet 650 mg (has no administration in time range)    Or  acetaminophen (TYLENOL) suppository 650 mg (has no administration in time range)  HYDROcodone-acetaminophen (NORCO/VICODIN) 5-325 MG per tablet 1 tablet (has no administration in time range)  morphine (PF) 2 MG/ML injection 2 mg (has no administration in time range)  albuterol (PROVENTIL) (2.5 MG/3ML) 0.083% nebulizer solution 2.5 mg (2.5 mg Nebulization Given 02/07/22 2112)  losartan (COZAAR) tablet 50 mg (has no administration in time range)  hydrochlorothiazide (HYDRODIURIL) tablet 12.5 mg (has no administration in time range)  cefTRIAXone (ROCEPHIN) 1 g in sodium chloride 0.9 % 100 mL IVPB (0 g Intravenous Stopped 02/07/22 1912)  azithromycin (ZITHROMAX) 500 mg in sodium chloride 0.9 % 250 mL IVPB (0 mg Intravenous Stopped 02/07/22 2031)   IMPRESSION / MDM / ASSESSMENT AND PLAN / ED COURSE  I reviewed the triage vital signs and the nursing notes.                             The patient is on the cardiac monitor to evaluate for evidence of arrhythmia and/or significant heart rate changes. Patient's presentation is most consistent with acute presentation with potential threat to life or bodily function. Presents with shortness of breath and malaise concerning for pneumonia.  DDx: PE, COPD exacerbation, Pneumothorax, TB, Atypical ACS, Esophageal Rupture, Toxic Exposure, Foreign Body Airway Obstruction.  Workup: CXR CBC, CMP, lactate, troponin  Given History, Exam, and Workup presentation most consistent with  pneumonia.  Findings: CT PE showing right-sided pneumonia  Tx: Ceftriaxone 1g IV Azithromycin '500mg'$  IV  Reassessment: As patient is continuing to require supplemental oxygenation for acute hypoxic respiratory failure, patient will require admission to the internal medicine service for further evaluation and management  Disposition: Admit   FINAL CLINICAL IMPRESSION(S) / ED DIAGNOSES   Final diagnoses:  COPD exacerbation ([place])  Acute and chronic respiratory failure with hypoxia ([place])   Rx / DC Orders   ED Discharge Orders     None      Note:  This document was prepared using Dragon voice recognition software and may include unintentional dictation errors.   Naaman Plummer, MD 02/08/22 0030

## 2022-02-07 NOTE — Assessment & Plan Note (Signed)
We will follow BGT. A1c as needed.

## 2022-02-07 NOTE — Assessment & Plan Note (Addendum)
Patient has a history of hematuria we will repeat urinalysis. 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 [facility] urogynecology referral in 03/2021.

## 2022-02-07 NOTE — Assessment & Plan Note (Addendum)
Continue patient on rosuvastatin metoprolol. Mild troponin elevation no chest pain or any and all symptoms.  We will follow EKG is abnormal and unchanged from prior .

## 2022-02-07 NOTE — Assessment & Plan Note (Signed)
Vitals:   02/07/22 1423 02/07/22 1716  BP: 116/84 (!) 147/99  We will continue same dose Hyzaar if blood pressure varies with low blood pressure and high pressure will separate medications and titrate dose further.

## 2022-02-07 NOTE — Assessment & Plan Note (Signed)
Continue patient on her Celexa and alprazolam.

## 2022-02-08 ENCOUNTER — Inpatient Hospital Stay
Admit: 2022-02-08 | Discharge: 2022-02-08 | Disposition: A | Discharge status: Home / Self Care | Payer: Medicare HMO | Attending: Pulmonary Disease | Admitting: Pulmonary Disease

## 2022-02-08 DIAGNOSIS — J9601 Acute respiratory failure with hypoxia: Secondary | ICD-10-CM | POA: Diagnosis not present

## 2022-02-08 LAB — ECHOCARDIOGRAM COMPLETE
AR max vel: 1.91 cm2
AV Area VTI: 1.7 cm2
AV Area mean vel: 1.73 cm2
AV Mean grad: 4 mmHg
AV Peak grad: 7 mmHg
Ao pk vel: 1.32 m/s
Area-P 1/2: 7.59 cm2
Calc EF: 24.9 %
Height: 61 in
S' Lateral: 3.5 cm
Single Plane A2C EF: 24.8 %
Single Plane A4C EF: 25.6 %
Weight: 2656 oz

## 2022-02-08 LAB — CBC
HCT: 39.9 % (ref 36.0–46.0)
Hemoglobin: 12.8 g/dL (ref 12.0–15.0)
MCH: 29.4 pg (ref 26.0–34.0)
MCHC: 32.1 g/dL (ref 30.0–36.0)
MCV: 91.5 fL (ref 80.0–100.0)
Platelets: 199 10*3/uL (ref 150–400)
RBC: 4.36 MIL/uL (ref 3.87–5.11)
RDW: 14.4 % (ref 11.5–15.5)
WBC: 5.9 10*3/uL (ref 4.0–10.5)
nRBC: 0 % (ref 0.0–0.2)

## 2022-02-08 LAB — COMPREHENSIVE METABOLIC PANEL
ALT: 12 U/L (ref 0–44)
AST: 19 U/L (ref 15–41)
Albumin: 3.5 g/dL (ref 3.5–5.0)
Alkaline Phosphatase: 30 U/L — ABNORMAL LOW (ref 38–126)
Anion gap: 9 (ref 5–15)
BUN: 15 mg/dL (ref 8–23)
CO2: 24 mmol/L (ref 22–32)
Calcium: 8.8 mg/dL — ABNORMAL LOW (ref 8.9–10.3)
Chloride: 107 mmol/L (ref 98–111)
Creatinine, Ser: 0.8 mg/dL (ref 0.44–1.00)
GFR, Estimated: >60 mL/min (ref >60)
Glucose, Bld: 131 mg/dL — ABNORMAL HIGH (ref 70–99)
Potassium: 3.1 mmol/L — ABNORMAL LOW (ref 3.5–5.1)
Sodium: 140 mmol/L (ref 135–145)
Total Bilirubin: 0.8 mg/dL (ref 0.3–1.2)
Total Protein: 6.7 g/dL (ref 6.5–8.1)

## 2022-02-08 LAB — SEDIMENTATION RATE: Sed Rate: 37 mm/hr — ABNORMAL HIGH (ref 0–30)

## 2022-02-08 LAB — MAGNESIUM: Magnesium: 1.9 mg/dL (ref 1.7–2.4)

## 2022-02-08 LAB — BRAIN NATRIURETIC PEPTIDE: B Natriuretic Peptide: 72.1 pg/mL (ref 0.0–100.0)

## 2022-02-08 LAB — C-REACTIVE PROTEIN: CRP: 2.4 mg/dL — ABNORMAL HIGH (ref <1.0)

## 2022-02-08 MED ORDER — UMECLIDINIUM BROMIDE 62.5 MCG/ACT IN AEPB
1.0000 | INHALATION_SPRAY | Freq: Every day | RESPIRATORY_TRACT | Status: DC
  Administered 2022-02-08 – 2022-02-09 (×2): 1 via RESPIRATORY_TRACT
  Filled 2022-02-08: qty 7

## 2022-02-08 MED ORDER — DOXYCYCLINE HYCLATE 100 MG PO TABS
100.0000 mg | ORAL_TABLET | Freq: Two times a day (BID) | ORAL | Status: DC
  Administered 2022-02-09: 100 mg via ORAL
  Filled 2022-02-08: qty 1

## 2022-02-08 MED ORDER — GABAPENTIN 300 MG PO CAPS
300.0000 mg | ORAL_CAPSULE | Freq: Two times a day (BID) | ORAL | Status: DC
  Administered 2022-02-08 – 2022-02-09 (×3): 300 mg via ORAL
  Filled 2022-02-08 (×3): qty 1

## 2022-02-08 MED ORDER — DOXYCYCLINE HYCLATE 100 MG PO TABS: 100.0000 mg | ORAL_TABLET | Freq: Two times a day (BID) | ORAL | Status: DC

## 2022-02-08 MED ORDER — ENOXAPARIN SODIUM 40 MG/0.4ML IJ SOSY
40.0000 mg | PREFILLED_SYRINGE | INTRAMUSCULAR | Status: DC
  Administered 2022-02-08 – 2022-02-09 (×2): 40 mg via SUBCUTANEOUS
  Filled 2022-02-08 (×2): qty 0.4

## 2022-02-08 MED ORDER — SACUBITRIL-VALSARTAN 24-26 MG PO TABS
1.0000 | ORAL_TABLET | Freq: Two times a day (BID) | ORAL | Status: DC
  Administered 2022-02-08 – 2022-02-09 (×3): 1 via ORAL
  Filled 2022-02-08 (×3): qty 1

## 2022-02-08 MED ORDER — FUROSEMIDE 20 MG PO TABS
20.0000 mg | ORAL_TABLET | Freq: Every day | ORAL | Status: DC
  Administered 2022-02-08 – 2022-02-09 (×2): 20 mg via ORAL
  Filled 2022-02-08 (×2): qty 1

## 2022-02-08 MED ORDER — MELATONIN 5 MG PO TABS
5.0000 mg | ORAL_TABLET | Freq: Every day | ORAL | Status: DC
  Administered 2022-02-08 (×2): 5 mg via ORAL
  Filled 2022-02-08 (×2): qty 1

## 2022-02-08 MED ORDER — GABAPENTIN 300 MG PO CAPS
600.0000 mg | ORAL_CAPSULE | Freq: Every day | ORAL | Status: DC
  Administered 2022-02-08 (×2): 600 mg via ORAL
  Filled 2022-02-08 (×2): qty 2

## 2022-02-08 MED ORDER — SPIRONOLACTONE 12.5 MG HALF TABLET
12.5000 mg | ORAL_TABLET | Freq: Two times a day (BID) | ORAL | Status: DC
  Administered 2022-02-08 – 2022-02-09 (×3): 12.5 mg via ORAL
  Filled 2022-02-08 (×5): qty 1

## 2022-02-08 MED ORDER — ALBUTEROL SULFATE (2.5 MG/3ML) 0.083% IN NEBU: 2.5000 mg | INHALATION_SOLUTION | RESPIRATORY_TRACT | Status: DC | PRN

## 2022-02-08 MED ORDER — PREDNISONE 20 MG PO TABS
20.0000 mg | ORAL_TABLET | Freq: Every day | ORAL | Status: DC
  Administered 2022-02-09: 20 mg via ORAL
  Filled 2022-02-08: qty 1

## 2022-02-08 MED ORDER — POTASSIUM CHLORIDE CRYS ER 20 MEQ PO TBCR
60.0000 meq | EXTENDED_RELEASE_TABLET | Freq: Once | ORAL | Status: AC
  Administered 2022-02-08: 60 meq via ORAL
  Filled 2022-02-08: qty 3

## 2022-02-08 MED ORDER — FLUTICASONE FUROATE-VILANTEROL 100-25 MCG/ACT IN AEPB
1.0000 | INHALATION_SPRAY | Freq: Every day | RESPIRATORY_TRACT | Status: DC
  Administered 2022-02-08 – 2022-02-09 (×2): 1 via RESPIRATORY_TRACT
  Filled 2022-02-08: qty 28

## 2022-02-08 NOTE — Consult Note (Signed)
[place] NOTE       Patient ID: Chelsea Fitzgerald MRN: [id number] DOB/AGE: 12-29-1945 76 y.o.  Admit date: 02/07/2022 Referring Physician Dr. Florina Ou Primary Physician Dr. Baldemar Lenis  Primary Cardiologist Dr. 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 on 02/07/2022 with shortness of breath over the past month since she had COVID.  Cardiology is consulted for further evaluation of a cardiac cause of her dyspnea.  Patient states she and her husband went on a cruise at the end of August and tested positive for coronavirus on 8/29.  She had primarily flulike symptoms that lasted for a couple weeks, and ever since then she has had worsening dyspnea on exertion.  She was previously able to walk around the mall several times a week for 30 to 45 minutes for exercise, but she has had difficulty doing this since having COVID.  She notably denies any chest discomfort during all of this, she notes several occasions of "her heart beating out of her chest" that lasted at greatest a minute at a time and resolved when she laid down.  She did not check her heart rate during the first couple episodes, but the third episode she noted her heart rate was 104 bpm.  Since having COVID she has had some orthopnea, feeling better sleeping with adjusting her mattress to an incline.  She has had occasional dizziness, without syncope, no peripheral edema or PND.  She was seen by Dr. Clayborn Bigness on 11/7 where reportedly her oxygen levels were low, when she sat down and took deep breaths her O2 sats recovered.  He started her on Entresto, spironolactone, and Lasix at that visit, which she took for the first time on Wednesday 11/8 and referred her to  pulmonology.  He was concerned about a possible pulmonary embolus causing the symptoms, so he ordered a CTA chest to be done.  When she showed up for her CT scan on 11/9 she was hypoxic to 84% on room air, and was still hypoxic following the imaging and she was recommended to present to the ER for further evaluation because of this.  In my time of evaluation the patient is sitting upright in appears somewhat increased work of breathing with conversation, on 3 L by nasal cannula and saturating >90%.  She noted some left shoulder pain she attributes to a torn rotator cuff that improved with hydrocodone overnight.  She is in sinus rhythm with a left bundle branch block on telemetry without evidence of arrhythmias.  Blood pressure 139/78.  Labs notable for hypokalemia with potassium 3.1, BUN/creatinine 15/0.82 GFR greater than 60.  BNP negative at 72, high-sensitivity troponin minimally elevated with a flat trend at 24-27.  No leukocytosis, H&H within normal limits at 12.8/39.9.  Her CTA chest was negative for pulmonary embolus, but did show nodular consolidative airspace opacity in the inferior RUL abutting the minor fissure measuring 2.4 x 1.9 cm with adjacent groundglass, likely infectious or inflammatory. An additional incidental 0.6 cm nodule of the anterior RUL that could also be infectious or inflammatory.  With gross enlargement of the main pulmonary artery that can be seen with pulmonary hypertension.  Review of systems complete and found to be negative unless listed  above     Past Medical History:  Diagnosis Date   Anxiety    Arthritis    Carotid stenosis, bilateral    Coronary artery disease    Depression    Hyperlipidemia    Hypertension    LBBB (left bundle branch block) 12/28/2018   Noted on EKG   Migraines    Neuropathy    STEMI (ST elevation myocardial infarction) (HCC)    SUI (stress urinary incontinence, female)    Uterine prolapse    Varicose veins     Past Surgical  History:  Procedure Laterality Date   BREAST BIOPSY Left 03/20/2016   FRAGMENT OF CYST WALL.   BREAST BIOPSY Right 05/29/2017   ribbon clip, benign   CAROTID STENT     CHOLECYSTECTOMY     COLONOSCOPY     COLONOSCOPY WITH PROPOFOL N/A 06/05/2021   Procedure: COLONOSCOPY WITH PROPOFOL;  Surgeon: Lin Landsman, MD;  Location: [facility] ENDOSCOPY;  Service: Gastroenterology;  Laterality: N/A;   COLONOSCOPY WITH PROPOFOL N/A 08/20/2021   Procedure: COLONOSCOPY WITH PROPOFOL;  Surgeon: Rush Landmark Telford Nab., MD;  Location: [facility] ENDOSCOPY;  Service: Gastroenterology;  Laterality: N/A;   CORONARY ANGIOPLASTY     ENDOSCOPIC MUCOSAL RESECTION N/A 08/20/2021   Procedure: ENDOSCOPIC MUCOSAL RESECTION;  Surgeon: Rush Landmark Telford Nab., MD;  Location: [facility] ENDOSCOPY;  Service: Gastroenterology;  Laterality: N/A;   HEMOSTASIS CLIP PLACEMENT  08/20/2021   Procedure: HEMOSTASIS CLIP PLACEMENT;  Surgeon: Irving Copas., MD;  Location: [facility];  Service: Gastroenterology;;   PARTIAL KNEE ARTHROPLASTY Left 01/18/2019   Procedure: Left knee medial unicompartmental arthroplasty;  Surgeon: Gaynelle Arabian, MD;  Location: [facility] ORS;  Service: Orthopedics;  Laterality: Left;  23mn   POLYPECTOMY  08/20/2021   Procedure: POLYPECTOMY;  Surgeon: Mansouraty, GTelford Nab, MD;  [facility];  Service: Gastroenterology;;   SHOULDER SURGERY Right 10/2020   SUBMUCOSAL LIFTING INJECTION  08/20/2021   Procedure: SUBMUCOSAL LIFTING INJECTION;  Surgeon: MIrving Copas, MD;  [facility];  Service: Gastroenterology;;   UMBILICAL HERNIA REPAIR      (Not in a hospital admission)  Social History   Socioeconomic History   Marital status: Married    Spouse name: Not on file   Number of children: 2   Years of education: Not on file   Highest education level: Not on file  Occupational History   Not on file  Tobacco Use   Smoking status: Former    Packs/day: 1.00    Types: Cigarettes     Quit date: 01/22/2012    Years since quitting: 10.0   Smokeless tobacco: Never   Tobacco comments:    594  Vaping Use   Vaping Use: Never used  Substance and Sexual Activity   Alcohol use: Yes    Comment: occas   Drug use: Never   Sexual activity: Not Currently  Other Topics Concern   Not on file  Social History Narrative   Not on file   Social Determinants of Health   Financial Resource Strain: Not on file  Food Insecurity: No Food Insecurity (02/08/2022)   Hunger Vital Sign    Worried About Running Out of Food in the Last Year: Never true    Ran Out of Food in the Last Year: Never true  Transportation Needs: No Transportation Needs (02/08/2022)   PRAPARE - THydrologist(Medical): No    Lack of Transportation (Non-Medical): No  Physical Activity: Not on file  Stress:  Not on file  Social Connections: Not on file  Intimate Partner Violence: Not At Risk (02/08/2022)   Humiliation, Afraid, Rape, and Kick questionnaire    Fear of Current or Ex-Partner: No    Emotionally Abused: No    Physically Abused: No    Sexually Abused: No    Family History  Problem Relation Age of Onset   Prostate cancer Father    Hypertension Father    Heart attack Father    Stroke Father    Breast cancer Sister 56   Breast cancer Daughter 71   Colon cancer Neg Hx    Esophageal cancer Neg Hx    Stomach cancer Neg Hx    Pancreatic cancer Neg Hx    Colon polyps Neg Hx    Inflammatory bowel disease Neg Hx    Liver disease Neg Hx    Rectal cancer Neg Hx      Vitals:   02/08/22 0414 02/08/22 0604 02/08/22 0700 02/08/22 0843  BP: 103/70 114/67  139/78  Pulse: 86 82    Resp: (!) 22 18    Temp:   97.9 F (36.6 C) 97.9 F (36.6 C)  TempSrc:   Oral Oral  SpO2: 90% 90%  92%  Weight:      Height:        PHYSICAL EXAM General: Pleasant elderly Caucasian female, well nourished, in no acute distress.  Sitting upright in hospital bed. HEENT:  Normocephalic and  atraumatic. Neck:  No JVD.  Lungs: Slight conversational dyspnea on 3 L by nasal cannula.  Trace rhonchi throughout with mild expiratory wheezing without crackles.   Heart: HRRR . Normal S1 and S2.  No appreciable murmurs.  Abdomen: Non-distended appearing with excess adiposity.  Msk: Normal strength and tone for age. Extremities: Warm and well perfused. No clubbing, cyanosis.  No peripheral edema.  Neuro: Alert and oriented X 3. Psych:  Answers questions appropriately.   Labs: Basic Metabolic Panel: Recent Labs    02/07/22 1434 02/08/22 0429  NA 136 140  K 3.5 3.1*  CL 103 107  CO2 22 24  GLUCOSE 95 131*  BUN 17 15  CREATININE 0.91 0.80  CALCIUM 9.2 8.8*  MG  --  1.9   Liver Function Tests: Recent Labs    02/08/22 0429  AST 19  ALT 12  ALKPHOS 30*  BILITOT 0.8  PROT 6.7  ALBUMIN 3.5   No results for input(s): "LIPASE", "AMYLASE" in the last 72 hours. CBC: Recent Labs    02/07/22 1434 02/08/22 0429  WBC 6.3 5.9  HGB 13.8 12.8  HCT 42.6 39.9  MCV 90.8 91.5  PLT 219 199   Cardiac Enzymes: Recent Labs    02/07/22 1434 02/07/22 1717  TROPONINIHS 24* 27*   BNP: Recent Labs    02/08/22 0429  BNP 72.1   D-Dimer: No results for input(s): "DDIMER" in the last 72 hours. Hemoglobin A1C: No results for input(s): "HGBA1C" in the last 72 hours. Fasting Lipid Panel: No results for input(s): "CHOL", "HDL", "LDLCALC", "TRIG", "CHOLHDL", "LDLDIRECT" in the last 72 hours. Thyroid Function Tests: No results for input(s): "TSH", "T4TOTAL", "T3FREE", "THYROIDAB" in the last 72 hours.  Invalid input(s): "FREET3" Anemia Panel: No results for input(s): "VITAMINB12", "FOLATE", "FERRITIN", "TIBC", "IRON", "RETICCTPCT" in the last 72 hours.   Radiology: CT Angio Chest Pulmonary Embolism (PE) W or WO Contrast  Result Date: 02/07/2022 CLINICAL DATA:  Shortness of breath, cardiomegaly EXAM: CT ANGIOGRAPHY CHEST WITH CONTRAST TECHNIQUE: Multidetector CT imaging of  the  chest was performed using the standard protocol during bolus administration of intravenous contrast. Multiplanar CT image reconstructions and MIPs were obtained to evaluate the vascular anatomy. RADIATION DOSE REDUCTION: This exam was performed according to the departmental dose-optimization program which includes automated exposure control, adjustment of the mA and/or kV according to patient size and/or use of iterative reconstruction technique. CONTRAST:  3m OMNIPAQUE IOHEXOL 350 MG/ML SOLN COMPARISON:  None Available. FINDINGS: Cardiovascular: Satisfactory opacification of the pulmonary arteries to the segmental level. No evidence of pulmonary embolism. Cardiomegaly. Three-vessel coronary artery calcifications. Gross enlargement of the main pulmonary artery measuring up to 4.6 cm in caliber. Small pericardial effusion. Aortic atherosclerosis. 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 Mediastinum/Nodes: No enlarged mediastinal, hilar, or axillary lymph nodes. Thyroid gland, trachea, and esophagus demonstrate no significant findings. Lungs/Pleura: Moderate centrilobular and paraseptal emphysema. Somewhat nodular, consolidative airspace opacity of the inferior right upper lobe abutting the minor fissure, measuring 2.4 x 1.9 cm, with adjacent ground-glass (series 7, image 64). 0.6 cm nodule of the anterior right upper lobe (series 7, image 51). No pleural effusion or pneumothorax. Upper Abdomen: No acute abnormality. Coarse contour of the liver. Partially imaged superior pole left renal calculus. Musculoskeletal: No chest wall abnormality. No acute osseous findings. Review of the MIP images confirms the above findings. IMPRESSION: 1. Negative examination for pulmonary embolism. 2. Somewhat nodular, consolidative airspace opacity of the inferior right upper lobe abutting the minor fissure, measuring 2.4 x 1.9 cm, with adjacent ground-glass. Although most  likely infectious or inflammatory, malignancy is not excluded. Recommend follow-up CT in 3 months to ensure resolution. 3. Additional incidental 0.6 cm nodule of the anterior right upper lobe, also possibly infectious or inflammatory. Attention on follow-up at the time of above recommended CT. 4. Cardiomegaly and coronary artery disease. 5. Gross enlargement of the main pulmonary artery, as can be seen in pulmonary hypertension. 6. Small pericardial effusion. 7. Emphysema. 8. Coarse contour of the liver, suggestive of cirrhosis. Correlate with biochemical findings. 9. Left nephrolithiasis. Aortic Atherosclerosis (ICD10-I70.0) and Emphysema (ICD10-J43.9). Electronically Signed   By: ADelanna AhmadiM.D.   On: 02/07/2022 14:32   MM 3D SCREEN BREAST BILATERAL  Result Date: 01/14/2022 CLINICAL DATA:  Screening. EXAM: DIGITAL SCREENING BILATERAL MAMMOGRAM WITH TOMOSYNTHESIS AND CAD TECHNIQUE: Bilateral screening digital craniocaudal and mediolateral oblique mammograms were obtained. Bilateral screening digital breast tomosynthesis was performed. The images were evaluated with computer-aided detection. COMPARISON:  Previous exam(s). ACR Breast Density Category b: There are scattered areas of fibroglandular density. FINDINGS: There are no findings suspicious for malignancy. IMPRESSION: No mammographic evidence of malignancy. A result letter of this screening mammogram will be mailed directly to the patient. RECOMMENDATION: Screening mammogram in one year. (Code:SM-B-01Y) BI-RADS CATEGORY  1: Negative. Electronically Signed   By: SKristopher OppenheimM.D.   On: 01/14/2022 09:29    ECHO 12/07/2020 INTERPRETATION  MILD LV SYSTOLIC DYSFUNCTION (See above)  NORMAL RIGHT VENTRICULAR SYSTOLIC FUNCTION  MILD VALVULAR REGURGITATION (See above)  NO VALVULAR STENOSIS  TRIVIAL PERICARDIAL EFFUSION  LVH: MILD LVH  Contraction: REGIONALLY IMPAIRED  Mitral: MILD MR  Tricuspid: MILD TR  Fluid: TRIVIAL EFFUSION  Closest EF: 45%  (Estimated)  Dias.FxClass: (Grade 1) relaxation abnormal, E/A reversal  LVH: CONCENTRIC   Lexiscan Myoview 01/13/2019   There was no ST segment deviation noted during stress. No T wave inversion was noted during stress. This is a low risk study. The left ventricular ejection fraction is mildly decreased (  45-54%).   Fixed inferolateral defect. Low risk study No evidence of reversible ischemia  TELEMETRY reviewed by me (LT) 02/08/2022 : Sinus rhythm 80s  EKG reviewed by me: NSR first-degree AV block chronic LBBB rate 74  Data reviewed by me (LT) 02/08/2022: Cardiology note from 01/2021, ED note admission H&P hospitalist progress note CBC BMP BNP troponins  Principal Problem:   Acute respiratory failure with hypoxia (HCC) Active Problems:   Essential hypertension   CAD (coronary artery disease)   Generalized anxiety disorder   Hematuria   Impaired fasting glucose   Abnormal CT scan, chest   Acute respiratory failure with hypoxemia (HCC)    ASSESSMENT AND PLAN:  Lynell S. 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  Cardiology is consulted for further evaluation of a cardiac cause of her dyspnea.  #Acute hypoxic respiratory failure #Abnormal CT chest #COPD Reports worsening dyspnea on exertion and exercise intolerance since she had COVID at the end of August, she was seen by Dr. Clayborn Bigness on 11/7 who was concerned about a possible pulmonary embolus so he ordered a CTA chest, that was negative for an acute PE, but did show a dilated pulmonary artery, possibly suggestive of pulmonary hypertension. -Pulmonology consult pending, appreciate assistance  #Chronic HFmrEF BNP is negative at 71 she appears clinically  euvolemic without evidence of active heart failure symptoms/exacerbation.  Suspect her dyspnea is related primarily to a pulmonary cause. -Continue GDMT with metoprolol XL 50 mg daily, Entresto 24-26 mg twice daily, spironolactone 25 mg once daily, Lasix 20 mg daily -Discussed with the patient that she should NOT be taking her losartan-HCTZ with Entresto/Lasix. -Repeat echocardiogram complete to assess for LVEF and any valvular abnormalities -No invasive cardiac diagnostics planned during this admission.   #CAD without angina #Chronic LBBB #Elevated troponin History of BMS to the ?LAD with [facility] 12/2018 with a fixed inferolateral defect without evidence of reversible ischemia, low risk study.  She denies any chest discomfort associated with her dyspnea, her EKG demonstrates her chronic LBBB without ischemic ST wave or ST changes on admission.  Her troponin is minimally elevated with a flat trend at 24-27 which is nonspecific, and most consistent with demand/supply mismatch from hypoxia and not ACS  -Continue aspirin 81 mg and Crestor 5 mg  This patient's plan of care was discussed and created with Dr. Saralyn Pilar and he is in agreement.  Signed: Tristan Schroeder , PA-C 02/08/2022, 9:44 AM [facility] Cardiology

## 2022-02-08 NOTE — Consult Note (Signed)
NAME:  Chelsea Fitzgerald, MRN:  [id number], DOB:  [birth year], LOS: 1 ADMISSION DATE:  02/07/2022, CONSULTATION DATE:  02/08/2022 REFERRING MD:  Gwynne Edinger, MD , CHIEF COMPLAINT:  Hypoxia, abnormal CT chest   History of Present Illness:  Patient is a 76 year old former smoker (10 PY) with a history as noted below, who presented to [facility] on 07 February 2022 due to shortness of breath.  And was scheduled to get CT angio chest as an outpatient and was noted to be hypoxic coming off of the study and was referred to the ED for further evaluation.  Pulse ox was 75% after CT angio was performed.  Patient was evaluated in the emergency room where she gave the history that she has been monitoring her oxygen saturations at home and they at times have been as low as 50%.  She had had COVID-19 diagnosed 28 August this year and noticed she had a "hard time with it".  Since then she has noted increasing shortness of breath.  She has also noted that her oxygen saturations go down when she ambulates as noted above.  The patient does note however that truth be known, she has had issues with shortness of breath increasing gradually since August 2022 after she had shoulder surgery.  Recently she has not had any wheezes, fevers, chills or sweats after her COVID diagnosis.  She was treated with a prednisone taper and albuterol on 13 October by her primary physician Dr. Baldemar Lenis.  She noted that prednisone made her feel better.  She had chest x-ray performed on 20 December 2021 however results of that are not available for my review.  She has not had any nausea or vomiting.  He has had some issues with loose stools after procedure that was performed by GI.  She has had some mild left costochondral junction tenderness for the last several weeks.  Feels like she has been "hit" in that area.  She has never been evaluated by a pulmonologist, has never had pulmonary function testing.  She has significant history of coronary artery  disease status post MI with PCI in 2013, she quit smoking then.  She has been followed by Dr. Ubaldo Glassing for her cardiac issues.  Last echocardiogram was September 2022 with a reduced ejection fraction 45%, grade 1 DD, mild MR and mild TR.  As noted she had a CT angio chest yesterday which showed no pulmonary embolism, some nodular/consolidative airspace opacity on the right upper lobe with adjacent groundglass additional incidental 6 mm nodule on the anterior right upper lobe which could also be infectious/inflammatory.  There is enlargement of the main pulmonary artery which could indicate pulmonary hypertension and a small pericardial effusion.  Of note, the patient has significant emphysema noted on CT.  PCCM has been consulted for assistance with management.  Pertinent  Medical History  CAD, S/P MI + PCI 1438 Chronic Systolic HF Pulmonary Emphysema   Significant Hospital Events: Including procedures, antibiotic start and stop dates in addition to other pertinent events   11/9 admitted due to acute respiratory failure with hypoxia PCCM consulted for assistance with management of acute respiratory failure  Interim History / Subjective:  Patient states she feels better since admission, not in distress, comfortable with nasal cannula.  No cough or sputum production, no hemoptysis.  Desires to go home.  Objective   Blood pressure 114/67, pulse 82, temperature 97.9 F (36.6 C), temperature source Oral, resp. rate 18, height '5\' 1"'$  (1.549 m), weight 75.3  kg, SpO2 90 %.       No intake or output data in the 24 hours ending 02/08/22 0820 Filed Weights   02/07/22 1423  Weight: 75.3 kg   SpO2: 91 % O2 Flow Rate (L/min): 4 L/min   Examination: GENERAL: Obese woman, no acute distress, laying comfortably in bed, no tachypnea on nasal cannula.  No conversational dyspnea. HEAD: Normocephalic, atraumatic.  EYES: Pupils equal, round, reactive to light.  No scleral icterus.  MOUTH: Intact dentition,  oral mucosa moist, pharynx clear. NECK: Supple. No thyromegaly. Trachea midline. No JVD.  No adenopathy. PULMONARY: Good air entry bilaterally.  Coarse, otherwise, no adventitious sounds. CHEST WALL: Mild tenderness along the left costochondral junction. CARDIOVASCULAR: S1 and S2. Regular rate and rhythm.  No rubs, murmurs or gallops heard. ABDOMEN: Obese,, normoactive bowel sounds, no tenderness, benign.  No hepatosplenomegaly. MUSCULOSKELETAL: No joint deformity, no clubbing, no edema.  NEUROLOGIC: No overt focal deficit. SKIN: Intact,warm,dry. PSYCH: Mood and behavior normal.   Images Reviewed   Representative mages from CT chest performed 07 February 2022 showing the findings of concern on the right upper lobe, independently reviewed:      Assessment & Plan:  Acute respiratory failure with hypoxia Suspect more acute on chronic Underlying COPD (pulmonary emphysema) with exacerbation Query pulmonary hypertension Continue oxygen supplementation for oxygen saturations 88-92% We will place the patient on LABA/LAMA/ICS (Breo/Incruse) As needed albuterol Will need pulmonary function testing as an outpatient Prednisone 20 mg daily Patient will need oxygen supplementation at home  Inflammatory/infectious process right upper lobe Procalcitonin unremarkable/reassuring May be more inflammatory process Sed rate/CRP elevated Prednisone 20 mg daily x7 days, then 10 mg daily Doxycycline 100 mg twice daily (more for anti-inflammatory effects) x7 days Will need repeat CT chest to ensure resolution Findings can be followed as an outpatient  Chronic systolic heart failure Possible pulmonary hypertension Pericardial effusion Check echocardiogram Primary team has requested cardiology evaluation    Best Practice (right click and "Reselect all SmartList Selections" daily)   Diet/type: Regular consistency (see orders) DVT prophylaxis: LMWH GI prophylaxis: N/A Lines: N/A Foley:   N/A Code Status:  full code Last date of multidisciplinary goals of care discussion [N/A]  Labs   CBC: Recent Labs  Lab 02/07/22 1434 02/08/22 0429  WBC 6.3 5.9  HGB 13.8 12.8  HCT 42.6 39.9  MCV 90.8 91.5  PLT 219 702    Basic Metabolic Panel: Recent Labs  Lab 02/07/22 1434 02/08/22 0429  NA 136 140  K 3.5 3.1*  CL 103 107  CO2 22 24  GLUCOSE 95 131*  BUN 17 15  CREATININE 0.91 0.80  CALCIUM 9.2 8.8*   GFR: Estimated Creatinine Clearance: 55.5 mL/min (by C-G formula based on SCr of 0.8 mg/dL). Recent Labs  Lab 02/07/22 1434 02/07/22 1717 02/08/22 0429  PROCALCITON  --  <0.10  --   WBC 6.3  --  5.9    Liver Function Tests: Recent Labs  Lab 02/08/22 0429  AST 19  ALT 12  ALKPHOS 30*  BILITOT 0.8  PROT 6.7  ALBUMIN 3.5   No results for input(s): "LIPASE", "AMYLASE" in the last 168 hours. No results for input(s): "AMMONIA" in the last 168 hours.  ABG    Component Value Date/Time   HCO3 30.6 (H) 02/07/2022 1845   O2SAT 78.3 02/07/2022 1845     Coagulation Profile: No results for input(s): "INR", "PROTIME" in the last 168 hours.  Cardiac Enzymes: No results for input(s): "CKTOTAL", "CKMB", "CKMBINDEX", "TROPONINI"  in the last 168 hours.  HbA1C: Hgb A1c MFr Bld  Date/Time Value Ref Range Status  01/11/2019 11:26 AM 6.6 (H) 4.8 - 5.6 % Final    Comment:    (NOTE) Pre diabetes:          5.7%-6.4% Diabetes:              >6.4% Glycemic control for   <7.0% adults with diabetes     CBG: No results for input(s): "GLUCAP" in the last 168 hours.  Review of Systems:   A 10 point review of systems was performed and it is as noted above otherwise negative.  Past Medical History:  She,  has a past medical history of Anxiety, Arthritis, Carotid stenosis, bilateral, Coronary artery disease, Depression, Hyperlipidemia, Hypertension, LBBB (left bundle branch block) (12/28/2018), Migraines, Neuropathy, STEMI (ST elevation myocardial infarction) (Chelsea),  SUI (stress urinary incontinence, female), Uterine prolapse, and Varicose veins.   Surgical History:   Past Surgical History:  Procedure Laterality Date   BREAST BIOPSY Left 03/20/2016   FRAGMENT OF CYST WALL.   BREAST BIOPSY Right 05/29/2017   ribbon clip, benign   CAROTID STENT     CHOLECYSTECTOMY     COLONOSCOPY     COLONOSCOPY WITH PROPOFOL N/A 06/05/2021   Procedure: COLONOSCOPY WITH PROPOFOL;  Surgeon: Lin Landsman, MD;  Location: [facility] ENDOSCOPY;  Service: Gastroenterology;  Laterality: N/A;   COLONOSCOPY WITH PROPOFOL N/A 08/20/2021   Procedure: COLONOSCOPY WITH PROPOFOL;  Surgeon: Rush Landmark Telford Nab., MD;  Location: [facility] ENDOSCOPY;  Service: Gastroenterology;  Laterality: N/A;   CORONARY ANGIOPLASTY     ENDOSCOPIC MUCOSAL RESECTION N/A 08/20/2021   Procedure: ENDOSCOPIC MUCOSAL RESECTION;  Surgeon: Rush Landmark Telford Nab., MD;  Location: [facility] ENDOSCOPY;  Service: Gastroenterology;  Laterality: N/A;   HEMOSTASIS CLIP PLACEMENT  08/20/2021   Procedure: HEMOSTASIS CLIP PLACEMENT;  Surgeon: Irving Copas., MD;  Location: [facility];  Service: Gastroenterology;;   PARTIAL KNEE ARTHROPLASTY Left 01/18/2019   Procedure: Left knee medial unicompartmental arthroplasty;  Surgeon: Gaynelle Arabian, MD;  Location: [facility] ORS;  Service: Orthopedics;  Laterality: Left;  38mn   POLYPECTOMY  08/20/2021   Procedure: POLYPECTOMY;  Surgeon: Mansouraty, GTelford Nab, MD;  [facility];  Service: Gastroenterology;;   SHOULDER SURGERY Right 10/2020   SUBMUCOSAL LIFTING INJECTION  08/20/2021   Procedure: SUBMUCOSAL LIFTING INJECTION;  Surgeon: MIrving Copas, MD;  [facility];  Service: Gastroenterology;;   UMBILICAL HERNIA REPAIR       Social History:   reports that she quit smoking about 10 years ago. Her smoking use included cigarettes. She smoked an average of 1 pack per day. She has never used smokeless tobacco. She reports current alcohol use. She reports  that she does not use drugs.   Family History:  Her family history includes Breast cancer (age of onset: 529 in her daughter; Breast cancer (age of onset: 693 in her sister; Heart attack in her father; Hypertension in her father; Prostate cancer in her father; Stroke in her father. There is no history of Colon cancer, Esophageal cancer, Stomach cancer, Pancreatic cancer, Colon polyps, Inflammatory bowel disease, Liver disease, or Rectal cancer.   Allergies Allergies  Allergen Reactions   Corticosteroids Rash   Ace Inhibitors Other (See Comments)    Mouth ulcers   Lipitor [Atorvastatin] Diarrhea   Lisinopril Itching   Simvastatin Diarrhea   Statins Diarrhea     Home Medications  Prior to Admission medications   Medication Sig Start Date End Date  Taking? Authorizing Provider  ALPRAZolam Duanne Moron) 0.5 MG tablet Take 0.25 mg by mouth at bedtime.  03/17/14  Yes [provider]  citalopram (CELEXA) 20 MG tablet Take 20 mg by mouth daily.  12/28/15  Yes [provider]  fluticasone (FLONASE) 50 MCG/ACT nasal spray Place 2 sprays into both nostrils daily.  01/25/14  Yes [provider]  furosemide (LASIX) 20 MG tablet Take 20 mg by mouth daily.   Yes [provider]  gabapentin (NEURONTIN) 300 MG capsule Take 300-600 mg by mouth See admin instructions. 300 mg in morning, 300 mg in afternoon and 600 mg  at night 06/11/21  Yes [provider]  loratadine (CLARITIN) 10 MG tablet Take 10 mg by mouth daily as needed for allergies.    Yes [provider]  losartan-hydrochlorothiazide (HYZAAR) 50-12.5 MG tablet Take 1 tablet by mouth daily.  11/28/14  Yes [provider]  Melatonin 3 MG TBDP Take 3 mg by mouth at bedtime.   Yes [provider]  metoprolol succinate (TOPROL-XL) 50 MG 24 hr tablet Take 50 mg by mouth daily.  01/16/15  Yes [provider]  MISCELLANEOUS VAGINAL PRODUCTS VA Place 1 application  vaginally at  bedtime. INSERT 4 CLICKS (= 1ML) VAGINALLY AT BEDTIME TWO TIMES PER WEEK -REFILLS 48HR NOTICE - CUSTOM COMPOUND 12/13/21  Yes [provider]  montelukast (SINGULAIR) 10 MG tablet Take 10 mg by mouth at bedtime.   Yes [provider]  Polyethyl Glycol-Propyl Glycol (SYSTANE) 0.4-0.3 % SOLN Place 1 drop into both eyes at bedtime.   Yes [provider]  rosuvastatin (CRESTOR) 5 MG tablet Take 5 mg by mouth daily.   Yes [provider]  sacubitril-valsartan (ENTRESTO) 24-26 MG Take 1 tablet by mouth 2 (two) times daily.   Yes [provider]  spironolactone (ALDACTONE) 25 MG tablet Take 12.5 mg by mouth 2 (two) times daily. 02/05/22  Yes [provider]  estradiol (ESTRACE) 0.1 MG/GM vaginal cream Place vaginally 2 (two) times a week. Patient not taking: Reported on 02/07/2022 12/23/19   [provider]    Scheduled Meds:  albuterol  2.5 mg Nebulization Q6H   ALPRAZolam  0.25 mg Oral QHS   citalopram  20 mg Oral Daily   fluticasone  2 spray Each Nare Daily   gabapentin  300 mg Oral BID   gabapentin  600 mg Oral QHS   heparin  5,000 Units Subcutaneous Q12H   hydrochlorothiazide  12.5 mg Oral Daily   losartan  50 mg Oral Daily   melatonin  5 mg Oral QHS   metoprolol succinate  50 mg Oral Daily   montelukast  10 mg Oral QHS   rosuvastatin  5 mg Oral Daily   sodium chloride flush  3 mL Intravenous Q12H   Continuous Infusions:  azithromycin     cefTRIAXone (ROCEPHIN)  IV     PRN Meds:.acetaminophen **OR** acetaminophen, HYDROcodone-acetaminophen, morphine injection  Level 4 consult    After evaluation of the patient, I suspect that she has been having issues with hypoxemia for well over a year particularly hypoxemia on exertion.  This however was aggravated by her recent COVID infection.  By her CT chest it appears that her emphysema has significant and advanced.  She will need assessment of pulmonary function as an outpatient once  she is over this acute episode.  From our standpoint if the patient has no other issues precluding discharge she can be discharged home with supplemental oxygen  and medications as above.  We will see her in follow-up 12/7 at 11 AM at [facility].  Our office is located at the [facility]. [place]  Thank you for allowing [facility] to participate in this patient's care.  Renold Don, MD Advanced Bronchoscopy PCCM  P[facility]    *This note was dictated using voice recognition software/Dragon.  Despite best efforts to proofread, errors can occur which can change the meaning. Any transcriptional errors that result from this process are unintentional and may not be fully corrected at the time of dictation.

## 2022-02-08 NOTE — Progress Notes (Signed)
PROGRESS NOTE    Chelsea Fitzgerald  VFI:[id number] DOB: 03/25/1946 DOA: 02/07/2022 PCP: Derinda Late, MD  Outpatient Specialists: cardiology    Brief Narrative:   From admission h and p Chelsea Fitzgerald is an 76 y.o. female  seen for SOB. Chart review shows patient has been visiting primary care since December 20, 2021 for her shortness of breath and dyspnea on exertion that is progressively gotten worse.  Her O2 sats were noted to be dropping in the 80s.  And 70s with exertion. Patient has noted to be short of breath since contracting COVID in August.  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 Satira Mccallum cardiology last echocardiogram in September 2022 showed reduced ejection fraction of 45%. Per PCP note patient was suspected of COPD and COVID and will start started on prednisone and inhalers.   Since September patient has been seen a total of 3 times by PCP for this problem of shortness of breath since contracting COVID in September, October, November. Patient had a trial of Lasix for suspected CHF. Patient was seen by Dr. Clayborn Bigness cardiology who ordered a CT angio chest for suspected pulmonary embolism.   Assessment & Plan:   Principal Problem:   Acute respiratory failure with hypoxia (HCC) Active Problems:   Abnormal CT scan, chest   Essential hypertension   CAD (coronary artery disease)   Generalized anxiety disorder   Hematuria   Impaired fasting glucose   Acute respiratory failure with hypoxemia (HCC)  # Hypoxic respiratory failure See below for discussion of etiology. Breathing comfortably on 3 L - [place] O2, may very well d/c on it  # COPD History of heavy smoking though quit 10 years ago. CTA with findings of emphysema, no PE. Outpt steroid course was beneficial - pulm consulted, recs pending, will defer treatment regimen  to them  # Pulmonary nodules 2.4x1.9 cm opacity RUL, 0.6 cm opacity RUL. Read as likely infectious or inflammatory, malignancy not excluded. Procal wnl, no fever, leukocytosis, significant cough or sputum production - think infection less likely. - pulm eval pending, defer abx to them  # CAD Hx nstemi, s/p PCI in 2013. Here trop mildly elevated, query whether some degree of ischemic cardiomyopathy is contributing to current symptoms - cardiology is consulted - TTE and bnp are pending - home statin, metop  # HFmrEF No clear-cut decompensated chf as no pulm edema, LE edema, orthopnea or pnd. CTA does show findings consistent w/ possible pulmonary hypertension - bnp and TTE pending - cont home lasix, entresto, spiro, metop  # Hypokalemia - replete, f/u mg  # GAD - cont home celexa, xanax, gabapentin  DVT prophylaxis: lovenox Code Status: full Family Communication: none @ bedside  Level of care: Progressive Status is: Inpatient Remains inpatient appropriate because: need for inpt eval    Consultants:  Cardiology, pulm  Procedures: none  Antimicrobials:  pending    Subjective: No dyspnea at rest, no chest pain  Objective: Vitals:   02/08/22 0414 02/08/22 0604 02/08/22 0700 02/08/22 0843  BP: 103/70 114/67  139/78  Pulse: 86 82    Resp: (!) 22 18    Temp:   97.9 F (36.6 C) 97.9 F (36.6 C)  TempSrc:   Oral Oral  SpO2: 90% 90%  92%  Weight:      Height:  No intake or output data in the 24 hours ending 02/08/22 0907 Filed Weights   02/07/22 1423  Weight: 75.3 kg    Examination:  General exam: Appears calm and comfortable  Respiratory system: faint rales greatest right upper lung Cardiovascular system: S1 & S2 heard, RRR. No JVD, murmurs, rubs, gallops or clicks.  Gastrointestinal system: Abdomen is nondistended, soft and nontender. No organomegaly or masses felt. Normal bowel sounds heard. Central nervous system: Alert and oriented. No focal  neurological deficits. Extremities: Symmetric 5 x 5 power. Skin: No rashes, lesions or ulcers Psychiatry: Judgement and insight appear normal. Mood & affect appropriate.     Data Reviewed: I have personally reviewed following labs and imaging studies  CBC: Recent Labs  Lab 02/07/22 1434 02/08/22 0429  WBC 6.3 5.9  HGB 13.8 12.8  HCT 42.6 39.9  MCV 90.8 91.5  PLT 219 557   Basic Metabolic Panel: Recent Labs  Lab 02/07/22 1434 02/08/22 0429  NA 136 140  K 3.5 3.1*  CL 103 107  CO2 22 24  GLUCOSE 95 131*  BUN 17 15  CREATININE 0.91 0.80  CALCIUM 9.2 8.8*  MG  --  1.9   GFR: Estimated Creatinine Clearance: 55.5 mL/min (by C-G formula based on SCr of 0.8 mg/dL). Liver Function Tests: Recent Labs  Lab 02/08/22 0429  AST 19  ALT 12  ALKPHOS 30*  BILITOT 0.8  PROT 6.7  ALBUMIN 3.5   No results for input(s): "LIPASE", "AMYLASE" in the last 168 hours. No results for input(s): "AMMONIA" in the last 168 hours. Coagulation Profile: No results for input(s): "INR", "PROTIME" in the last 168 hours. Cardiac Enzymes: No results for input(s): "CKTOTAL", "CKMB", "CKMBINDEX", "TROPONINI" in the last 168 hours. BNP (last 3 results) No results for input(s): "PROBNP" in the last 8760 hours. HbA1C: No results for input(s): "HGBA1C" in the last 72 hours. CBG: No results for input(s): "GLUCAP" in the last 168 hours. Lipid Profile: No results for input(s): "CHOL", "HDL", "LDLCALC", "TRIG", "CHOLHDL", "LDLDIRECT" in the last 72 hours. Thyroid Function Tests: No results for input(s): "TSH", "T4TOTAL", "FREET4", "T3FREE", "THYROIDAB" in the last 72 hours. Anemia Panel: No results for input(s): "VITAMINB12", "FOLATE", "FERRITIN", "TIBC", "IRON", "RETICCTPCT" in the last 72 hours. Urine analysis: No results found for: "COLORURINE", "APPEARANCEUR", "LABSPEC", "PHURINE", "GLUCOSEU", "HGBUR", "BILIRUBINUR", "KETONESUR", "PROTEINUR", "UROBILINOGEN", "NITRITE", "LEUKOCYTESUR" Sepsis  Labs: '@LABRCNTIP'$ (procalcitonin:4,lacticidven:4)  )No results found for this or any previous visit (from the past 240 hour(s)).       Radiology Studies: CT Angio Chest Pulmonary Embolism (PE) W or WO Contrast  Result Date: 02/07/2022 CLINICAL DATA:  Shortness of breath, cardiomegaly EXAM: CT ANGIOGRAPHY CHEST WITH CONTRAST TECHNIQUE: Multidetector CT imaging of the chest was performed using the standard protocol during bolus administration of intravenous contrast. Multiplanar CT image reconstructions and MIPs were obtained to evaluate the vascular anatomy. RADIATION DOSE REDUCTION: This exam was performed according to the departmental dose-optimization program which includes automated exposure control, adjustment of the mA and/or kV according to patient size and/or use of iterative reconstruction technique. CONTRAST:  41m OMNIPAQUE IOHEXOL 350 MG/ML SOLN COMPARISON:  None Available. FINDINGS: Cardiovascular: Satisfactory opacification of the pulmonary arteries to the segmental level. No evidence of pulmonary embolism. Cardiomegaly. Three-vessel coronary artery calcifications. Gross enlargement of the main pulmonary artery measuring up to 4.6 cm in caliber. Small pericardial effusion. Aortic atherosclerosis. 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 Mediastinum/Nodes: No enlarged mediastinal, hilar, or axillary lymph nodes. Thyroid gland,  trachea, and esophagus demonstrate no significant findings. Lungs/Pleura: Moderate centrilobular and paraseptal emphysema. Somewhat nodular, consolidative airspace opacity of the inferior right upper lobe abutting the minor fissure, measuring 2.4 x 1.9 cm, with adjacent ground-glass (series 7, image 64). 0.6 cm nodule of the anterior right upper lobe (series 7, image 51). No pleural effusion or pneumothorax. Upper Abdomen: No acute abnormality. Coarse contour of the liver. Partially imaged superior pole left renal  calculus. Musculoskeletal: No chest wall abnormality. No acute osseous findings. Review of the MIP images confirms the above findings. IMPRESSION: 1. Negative examination for pulmonary embolism. 2. Somewhat nodular, consolidative airspace opacity of the inferior right upper lobe abutting the minor fissure, measuring 2.4 x 1.9 cm, with adjacent ground-glass. Although most likely infectious or inflammatory, malignancy is not excluded. Recommend follow-up CT in 3 months to ensure resolution. 3. Additional incidental 0.6 cm nodule of the anterior right upper lobe, also possibly infectious or inflammatory. Attention on follow-up at the time of above recommended CT. 4. Cardiomegaly and coronary artery disease. 5. Gross enlargement of the main pulmonary artery, as can be seen in pulmonary hypertension. 6. Small pericardial effusion. 7. Emphysema. 8. Coarse contour of the liver, suggestive of cirrhosis. Correlate with biochemical findings. 9. Left nephrolithiasis. Aortic Atherosclerosis (ICD10-I70.0) and Emphysema (ICD10-J43.9). Electronically Signed   By: Delanna Ahmadi M.D.   On: 02/07/2022 14:32        Scheduled Meds:  ALPRAZolam  0.25 mg Oral QHS   citalopram  20 mg Oral Daily   fluticasone  2 spray Each Nare Daily   fluticasone furoate-vilanterol  1 puff Inhalation Daily   gabapentin  300 mg Oral BID   gabapentin  600 mg Oral QHS   heparin  5,000 Units Subcutaneous Q12H   hydrochlorothiazide  12.5 mg Oral Daily   losartan  50 mg Oral Daily   melatonin  5 mg Oral QHS   metoprolol succinate  50 mg Oral Daily   montelukast  10 mg Oral QHS   potassium chloride  60 mEq Oral Once   rosuvastatin  5 mg Oral Daily   sodium chloride flush  3 mL Intravenous Q12H   umeclidinium bromide  1 puff Inhalation Daily   Continuous Infusions:  azithromycin     cefTRIAXone (ROCEPHIN)  IV       LOS: 1 day     Desma Maxim, MD [facility]   If 7PM-7AM, please contact  night-coverage [email or web address] Password [email or web address] 02/08/2022, 9:07 AM

## 2022-02-08 NOTE — Progress Notes (Signed)
*  PRELIMINARY RESULTS* Echocardiogram 2D Echocardiogram has been performed.  Chelsea Fitzgerald Char Zackerie Sara 02/08/2022, 11:37 AM

## 2022-02-09 DIAGNOSIS — J9601 Acute respiratory failure with hypoxia: Secondary | ICD-10-CM | POA: Diagnosis not present

## 2022-02-09 LAB — BASIC METABOLIC PANEL
Anion gap: 5 (ref 5–15)
BUN: 15 mg/dL (ref 8–23)
CO2: 27 mmol/L (ref 22–32)
Calcium: 8.9 mg/dL (ref 8.9–10.3)
Chloride: 109 mmol/L (ref 98–111)
Creatinine, Ser: 0.74 mg/dL (ref 0.44–1.00)
GFR, Estimated: >60 mL/min (ref >60)
Glucose, Bld: 116 mg/dL — ABNORMAL HIGH (ref 70–99)
Potassium: 4 mmol/L (ref 3.5–5.1)
Sodium: 141 mmol/L (ref 135–145)

## 2022-02-09 MED ORDER — DOXYCYCLINE HYCLATE 100 MG PO TABS: 100.0000 mg | ORAL_TABLET | Freq: Two times a day (BID) | ORAL | 0 refills | Status: DC

## 2022-02-09 MED ORDER — UMECLIDINIUM BROMIDE 62.5 MCG/ACT IN AEPB: 1.0000 | INHALATION_SPRAY | Freq: Every day | RESPIRATORY_TRACT | 0 refills | Status: DC

## 2022-02-09 MED ORDER — FLUTICASONE FUROATE-VILANTEROL 100-25 MCG/ACT IN AEPB: 1.0000 | INHALATION_SPRAY | Freq: Every day | RESPIRATORY_TRACT | 1 refills | Status: DC

## 2022-02-09 MED ORDER — PREDNISONE 10 MG PO TABS: ORAL_TABLET | ORAL | 1 refills | Status: DC

## 2022-02-09 NOTE — Progress Notes (Signed)
Patient discharged. Family at bedsid. All belongings with pt, along with delivered O2 tank.

## 2022-02-09 NOTE — TOC Transition Note (Signed)
Transition of Care [facility]) - CM/SW Discharge Note   Patient Details  Name: SHARNEE DOUGLASS MRN: [id number] Date of Birth: 08-Apr-1945  Transition of Care [facility]) CM/SW Contact:  Valente David, RN Phone Number: 02/09/2022, 12:10 PM   Clinical Narrative:     Patient admitted from home, lives with husband.  Dr. Baldemar Lenis is PCP, obtains medications from [place].  Already has walker, BSC, and shower chair at the home, has new order for home O2.  Call placed to [employer] as patient has no preference for DME, spoke with [facility].  Oxygen will be delivered to the bedside.  No further TOC  needs identified at this time.   Final next level of care: Home/Self Care Barriers to Discharge: Barriers Resolved   Patient Goals and CMS Choice Patient states their goals for this hospitalization and ongoing recovery are:: Home CMS Medicare.gov Compare Post Acute Care list provided to:: Patient    Discharge Placement                       Discharge Plan and Services                DME Arranged: Oxygen DME Agency: [employer] Date DME Agency Contacted: 02/09/22 Time DME Agency Contacted: [phone] Representative spoke with at DME Agency: [place] ([place]) Interventions     Readmission Risk Interventions     No data to display

## 2022-02-09 NOTE — Progress Notes (Signed)
[facility] Cardiology  SUBJECTIVE: Patient laying in bed, denies chest pain   Vitals:   02/08/22 2106 02/09/22 0016 02/09/22 0514 02/09/22 0834  BP: 124/78 122/75 120/67 136/75  Pulse: 81 72 72 73  Resp: '16 18 18 18  '$ Temp: 97.8 F (36.6 C) 97.6 F (36.4 C) 97.6 F (36.4 C) 98.7 F (37.1 C)  TempSrc: Oral   Oral  SpO2: 95% 95% 91% 91%  Weight:   78.3 kg   Height:         Intake/Output Summary (Last 24 hours) at 02/09/2022 1002 Last data filed at 02/09/2022 0900 Gross per 24 hour  Intake 423 ml  Output 240 ml  Net 183 ml      PHYSICAL EXAM  General: Well developed, well nourished, in no acute distress HEENT:  Normocephalic and atramatic Neck:  No JVD.  Lungs: Clear bilaterally to auscultation and percussion. Heart: HRRR . Normal S1 and S2 without gallops or murmurs.  Abdomen: Bowel sounds are positive, abdomen soft and non-tender  Msk:  Back normal, normal gait. Normal strength and tone for age. Extremities: No clubbing, cyanosis or edema.   Neuro: Alert and oriented X 3. Psych:  Good affect, responds appropriately   LABS: Basic Metabolic Panel: Recent Labs    02/07/22 1434 02/08/22 0429  NA 136 140  K 3.5 3.1*  CL 103 107  CO2 22 24  GLUCOSE 95 131*  BUN 17 15  CREATININE 0.91 0.80  CALCIUM 9.2 8.8*  MG  --  1.9   Liver Function Tests: Recent Labs    02/08/22 0429  AST 19  ALT 12  ALKPHOS 30*  BILITOT 0.8  PROT 6.7  ALBUMIN 3.5   No results for input(s): "LIPASE", "AMYLASE" in the last 72 hours. CBC: Recent Labs    02/07/22 1434 02/08/22 0429  WBC 6.3 5.9  HGB 13.8 12.8  HCT 42.6 39.9  MCV 90.8 91.5  PLT 219 199   Cardiac Enzymes: No results for input(s): "CKTOTAL", "CKMB", "CKMBINDEX", "TROPONINI" in the last 72 hours. BNP: Invalid input(s): "POCBNP" D-Dimer: No results for input(s): "DDIMER" in the last 72 hours. Hemoglobin A1C: No results for input(s): "HGBA1C" in the last 72 hours. Fasting Lipid Panel: No results for input(s):  "CHOL", "HDL", "LDLCALC", "TRIG", "CHOLHDL", "LDLDIRECT" in the last 72 hours. Thyroid Function Tests: No results for input(s): "TSH", "T4TOTAL", "T3FREE", "THYROIDAB" in the last 72 hours.  Invalid input(s): "FREET3" Anemia Panel: No results for input(s): "VITAMINB12", "FOLATE", "FERRITIN", "TIBC", "IRON", "RETICCTPCT" in the last 72 hours.  ECHOCARDIOGRAM COMPLETE  Result Date: 02/08/2022    ECHOCARDIOGRAM REPORT   Patient Name:   Chelsea Fitzgerald Date of Exam: 02/08/2022 Medical Rec #:  [id number]     Height:       61.0 in Accession #:    [id number]    Weight:       166.0 lb Date of Birth:  July 08, 1945    BSA:          1.745 m Patient Age:    46 years      BP:           139/78 mmHg Patient Gender: F             HR:           72 bpm. Exam Location:  [facility] Procedure: 2D Echo, Color Doppler and Cardiac Doppler Indications:     R06.00 Dyspnea  History:         Patient has no prior  history of Echocardiogram examinations.                  CAD; Risk Factors:Hypertension and Dyslipidemia.  Sonographer:     Charmayne Sheer Referring Phys:  [id number] CARMEN Veda Canning Diagnosing Phys: Isaias Cowman MD  Sonographer Comments: Suboptimal parasternal window. IMPRESSIONS  1. Left ventricular ejection fraction, by estimation, is 40 to 45%. The left ventricle has mildly decreased function. The left ventricle has no regional wall motion abnormalities. Left ventricular diastolic parameters are consistent with Grade I diastolic dysfunction (impaired relaxation).  2. Right ventricular systolic function is normal. The right ventricular size is normal.  3. The mitral valve is normal in structure. Mild mitral valve regurgitation. No evidence of mitral stenosis.  4. The aortic valve is normal in structure. Aortic valve regurgitation is not visualized. No aortic stenosis is present.  5. The inferior vena cava is normal in size with greater than 50% respiratory variability, suggesting right atrial pressure of 3 mmHg. FINDINGS  Left  Ventricle: Left ventricular ejection fraction, by estimation, is 40 to 45%. The left ventricle has mildly decreased function. The left ventricle has no regional wall motion abnormalities. The left ventricular internal cavity size was normal in size. There is no left ventricular hypertrophy. Left ventricular diastolic parameters are consistent with Grade I diastolic dysfunction (impaired relaxation). Right Ventricle: The right ventricular size is normal. No increase in right ventricular wall thickness. Right ventricular systolic function is normal. Left Atrium: Left atrial size was normal in size. Right Atrium: Right atrial size was normal in size. Pericardium: There is no evidence of pericardial effusion. Mitral Valve: The mitral valve is normal in structure. Mild mitral valve regurgitation. No evidence of mitral valve stenosis. Tricuspid Valve: The tricuspid valve is normal in structure. Tricuspid valve regurgitation is mild . No evidence of tricuspid stenosis. Aortic Valve: The aortic valve is normal in structure. Aortic valve regurgitation is not visualized. No aortic stenosis is present. Aortic valve mean gradient measures 4.0 mmHg. Aortic valve peak gradient measures 7.0 mmHg. Aortic valve area, by VTI measures 1.70 cm. Pulmonic Valve: The pulmonic valve was normal in structure. Pulmonic valve regurgitation is not visualized. No evidence of pulmonic stenosis. Aorta: The aortic root is normal in size and structure. Venous: The inferior vena cava is normal in size with greater than 50% respiratory variability, suggesting right atrial pressure of 3 mmHg. IAS/Shunts: No atrial level shunt detected by color flow Doppler.  LEFT VENTRICLE PLAX 2D LVIDd:         4.40 cm     Diastology LVIDs:         3.50 cm     LV e' medial:    4.79 cm/s LV PW:         1.20 cm     LV E/e' medial:  10.3 LV IVS:        1.30 cm     LV e' lateral:   6.42 cm/s LVOT diam:     1.90 cm     LV E/e' lateral: 7.7 LV SV:         43 LV SV Index:    25 LVOT Area:     2.84 cm  LV Volumes (MOD) LV vol d, MOD A2C: 78.5 ml LV vol d, MOD A4C: 80.9 ml LV vol s, MOD A2C: 59.0 ml LV vol s, MOD A4C: 60.2 ml LV SV MOD A2C:     19.5 ml LV SV MOD A4C:     80.9 ml  LV SV MOD BP:      20.3 ml RIGHT VENTRICLE RV Basal diam:  3.60 cm RV S prime:     9.68 cm/s LEFT ATRIUM             Index        RIGHT ATRIUM           Index LA diam:        4.30 cm 2.46 cm/m   RA Area:     14.40 cm LA Vol (A2C):   46.4 ml 26.59 ml/m  RA Volume:   30.10 ml  17.25 ml/m LA Vol (A4C):   55.9 ml 32.04 ml/m LA Biplane Vol: 56.6 ml 32.44 ml/m  AORTIC VALVE                    PULMONIC VALVE AV Area (Vmax):    1.91 cm     PV Vmax:       1.31 m/s AV Area (Vmean):   1.73 cm     PV Vmean:      83.600 cm/s AV Area (VTI):     1.70 cm     PV VTI:        0.220 m AV Vmax:           132.00 cm/s  PV Peak grad:  6.9 mmHg AV Vmean:          85.800 cm/s  PV Mean grad:  3.0 mmHg AV VTI:            0.253 m AV Peak Grad:      7.0 mmHg AV Mean Grad:      4.0 mmHg LVOT Vmax:         89.10 cm/s LVOT Vmean:        52.400 cm/s LVOT VTI:          0.152 m LVOT/AV VTI ratio: 0.60  AORTA Ao Root diam: 3.20 cm MITRAL VALVE MV Area (PHT): 7.59 cm     SHUNTS MV Decel Time: 100 msec     Systemic VTI:  0.15 m MV E velocity: 49.30 cm/s   Systemic Diam: 1.90 cm MV A velocity: 106.00 cm/s MV E/A ratio:  0.47 Isaias Cowman MD Electronically signed by Isaias Cowman MD Signature Date/Time: 02/08/2022/3:35:41 PM    Final    CT Angio Chest Pulmonary Embolism (PE) W or WO Contrast  Result Date: 02/07/2022 CLINICAL DATA:  Shortness of breath, cardiomegaly EXAM: CT ANGIOGRAPHY CHEST WITH CONTRAST TECHNIQUE: Multidetector CT imaging of the chest was performed using the standard protocol during bolus administration of intravenous contrast. Multiplanar CT image reconstructions and MIPs were obtained to evaluate the vascular anatomy. RADIATION DOSE REDUCTION: This exam was performed according to the departmental  dose-optimization program which includes automated exposure control, adjustment of the mA and/or kV according to patient size and/or use of iterative reconstruction technique. CONTRAST:  70m OMNIPAQUE IOHEXOL 350 MG/ML SOLN COMPARISON:  None Available. FINDINGS: Cardiovascular: Satisfactory opacification of the pulmonary arteries to the segmental level. No evidence of pulmonary embolism. Cardiomegaly. Three-vessel coronary artery calcifications. Gross enlargement of the main pulmonary artery measuring up to 4.6 cm in caliber. Small pericardial effusion. Aortic atherosclerosis. 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 Mediastinum/Nodes: No enlarged mediastinal, hilar, or axillary lymph nodes. Thyroid gland, trachea, and esophagus demonstrate no significant findings. Lungs/Pleura: Moderate centrilobular and paraseptal emphysema. Somewhat nodular, consolidative airspace opacity of the inferior right upper lobe abutting the minor fissure,  measuring 2.4 x 1.9 cm, with adjacent ground-glass (series 7, image 64). 0.6 cm nodule of the anterior right upper lobe (series 7, image 51). No pleural effusion or pneumothorax. Upper Abdomen: No acute abnormality. Coarse contour of the liver. Partially imaged superior pole left renal calculus. Musculoskeletal: No chest wall abnormality. No acute osseous findings. Review of the MIP images confirms the above findings. IMPRESSION: 1. Negative examination for pulmonary embolism. 2. Somewhat nodular, consolidative airspace opacity of the inferior right upper lobe abutting the minor fissure, measuring 2.4 x 1.9 cm, with adjacent ground-glass. Although most likely infectious or inflammatory, malignancy is not excluded. Recommend follow-up CT in 3 months to ensure resolution. 3. Additional incidental 0.6 cm nodule of the anterior right upper lobe, also possibly infectious or inflammatory. Attention on follow-up at the time of above  recommended CT. 4. Cardiomegaly and coronary artery disease. 5. Gross enlargement of the main pulmonary artery, as can be seen in pulmonary hypertension. 6. Small pericardial effusion. 7. Emphysema. 8. Coarse contour of the liver, suggestive of cirrhosis. Correlate with biochemical findings. 9. Left nephrolithiasis. Aortic Atherosclerosis (ICD10-I70.0) and Emphysema (ICD10-J43.9). Electronically Signed   By: Delanna Ahmadi M.D.   On: 02/07/2022 14:32     Echo EF 40-45% 02/08/2022  TELEMETRY: This rhythm 76 bpm:  ASSESSMENT AND PLAN:  Principal Problem:   Acute respiratory failure with hypoxia (HCC) Active Problems:   Essential hypertension   CAD (coronary artery disease)   Generalized anxiety disorder   Hematuria   Impaired fasting glucose   Abnormal CT scan, chest   Acute respiratory failure with hypoxemia (HCC)    1.  Chronic systolic congestive heart failure, HFmrEF, 40-45% 02/08/2022, on good medical management (metoprolol succinate, Entresto, spironolactone, furosemide) 2.  Acute hypoxic respiratory failure, COPD, history of COVID 10/2021, chest CT negative for pulmonary embolus 02/07/2022 3.  CAD, history of BMS LAD, currently without chest pain, high-sensitivity troponin borderline elevated (24, 27)  Recommendations  1.  Agree with current therapy 2.  Continue good medical management (metoprolol succinate, Entresto, spironolactone, furosemide) 3.  Defer further cardiac diagnostics at this time 4.  May discharge home from cardiovascular perspective 5.  Follow-up with Dr. Clayborn Bigness 1 to 2 weeks   Isaias Cowman, MD, PhD, [facility] 02/09/2022 10:02 AM

## 2022-02-09 NOTE — Discharge Summary (Signed)
Chelsea Fitzgerald AJG:[id number] DOB: 04/03/1945 DOA: 02/07/2022  PCP: Derinda Late, MD  Admit date: 02/07/2022 Discharge date: 02/09/2022  Time spent: 35 minutes  Recommendations for Outpatient Follow-up:  Cardiology and pulmonology f/u     Discharge Diagnoses:  Principal Problem:   Acute respiratory failure with hypoxia [facility]) Active Problems:   Abnormal CT scan, chest   Essential hypertension   CAD (coronary artery disease)   Generalized anxiety disorder   Hematuria   Impaired fasting glucose   Acute respiratory failure with hypoxemia [facility])   Discharge Condition: stable  Diet recommendation: heart healthy  Filed Weights   02/07/22 1423 02/09/22 0514  Weight: 75.3 kg 78.3 kg    History of present illness:  From admission h and p Chelsea Fitzgerald is an 76 y.o. female  seen for SOB. Chart review shows patient has been visiting primary care since December 20, 2021 for her shortness of breath and dyspnea on exertion that is progressively gotten worse.  Her O2 sats were noted to be dropping in the 80s.  And 70s with exertion. Patient has noted to be short of breath since contracting COVID in August.  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 Satira Mccallum cardiology last echocardiogram in September 2022 showed reduced ejection fraction of 45%. Per PCP note patient was suspected of COPD and COVID and will start started on prednisone and inhalers.   Since September patient has been seen a total of 3 times by PCP for this problem of shortness of breath since contracting COVID in September, October, November. Patient had a trial of Lasix for suspected CHF. Patient was seen by Dr. Clayborn Bigness cardiology who ordered a CT angio chest for suspected pulmonary embolism.  Hospital Course:  Patient presents with progressively worsening dyspnea and  newly-found hypoxia. Evaluated by pulmonology and cardiology. CTA without PE but does show new pulmonary nodules and evidence of emphysema. Recent covid likely exacerbated her background emphysema. Here requiring 4 liters and stable on that, will d/c on oxygen. Pulm advises starting breo and incruse as well as prednisone and a course of doxycycline, will f/u with them in December and will need outpatient f/u of those pulmonary nodules, PFTs, etc. 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  Procedures: none   Consultations: Pulmonology, cardiology  Discharge Exam: Vitals:   02/09/22 0834 02/09/22 1303  BP: 136/75 134/60  Pulse: 73 74  Resp: 18 20  Temp: 98.7 F (37.1 C) (!) 97.4 F (36.3 C)  SpO2: 91% 93%    General: NAD Cardiovascular: RRR Respiratory: few scattered wheezes  Discharge Instructions   Discharge Instructions     Diet - low sodium heart healthy   Complete by: As directed    Increase activity slowly   Complete by: As directed       Allergies as of 02/09/2022       Reactions   Ace Inhibitors Other (See Comments)   Mouth ulcers   Lipitor [atorvastatin] Diarrhea   Lisinopril Itching   Simvastatin Diarrhea   Corticosteroids Other (See Comments)   Patient feels flushed while taking the medication however, can tolerate it.  She has never developed a rash with the medication.   Statins Diarrhea        Medication List  STOP taking these medications    estradiol 0.1 MG/GM vaginal cream Commonly known as: ESTRACE   losartan-hydrochlorothiazide 50-12.5 MG tablet Commonly known as: HYZAAR       TAKE these medications    ALPRAZolam 0.5 MG tablet Commonly known as: XANAX Take 0.25 mg by mouth at bedtime.   citalopram 20 MG tablet Commonly known as: CELEXA Take 20 mg by mouth daily.   doxycycline 100 MG tablet Commonly known as: VIBRA-TABS Take 1 tablet (100 mg total) by mouth  every 12 (twelve) hours.   Entresto 24-26 MG Generic drug: sacubitril-valsartan Take 1 tablet by mouth 2 (two) times daily.   fluticasone 50 MCG/ACT nasal spray Commonly known as: FLONASE Place 2 sprays into both nostrils daily.   fluticasone furoate-vilanterol 100-25 MCG/ACT Aepb Commonly known as: BREO ELLIPTA Inhale 1 puff into the lungs daily. Start taking on: February 10, 2022   furosemide 20 MG tablet Commonly known as: LASIX Take 20 mg by mouth daily.   gabapentin 300 MG capsule Commonly known as: NEURONTIN Take 300-600 mg by mouth See admin instructions. 300 mg in morning, 300 mg in afternoon and 600 mg  at night   loratadine 10 MG tablet Commonly known as: CLARITIN Take 10 mg by mouth daily as needed for allergies.   Melatonin 3 MG Tbdp Take 3 mg by mouth at bedtime.   metoprolol succinate 50 MG 24 hr tablet Commonly known as: TOPROL-XL Take 50 mg by mouth daily.   MISCELLANEOUS VAGINAL PRODUCTS VA Place 1 application  vaginally at bedtime. INSERT 4 CLICKS (= 1ML) VAGINALLY AT BEDTIME TWO TIMES PER WEEK -REFILLS 48HR NOTICE - CUSTOM COMPOUND   montelukast 10 MG tablet Commonly known as: SINGULAIR Take 10 mg by mouth at bedtime.   predniSONE 10 MG tablet Commonly known as: DELTASONE 2 tabs daily for 6 days, then 1 tab daily Start taking on: February 10, 2022   rosuvastatin 5 MG tablet Commonly known as: CRESTOR Take 5 mg by mouth daily.   spironolactone 25 MG tablet Commonly known as: ALDACTONE Take 12.5 mg by mouth 2 (two) times daily.   Systane 0.4-0.3 % Soln Generic drug: Polyethyl Glycol-Propyl Glycol Place 1 drop into both eyes at bedtime.   umeclidinium bromide 62.5 MCG/ACT Aepb Commonly known as: INCRUSE ELLIPTA Inhale 1 puff into the lungs daily. Start taking on: February 10, 2022               Durable Medical Equipment  (From admission, onward)           Start     Ordered   02/09/22 1022  DME Oxygen  Once       Question  Answer Comment  Length of Need Lifetime   Liters per Minute 4   Frequency Continuous (stationary and portable oxygen unit needed)   Oxygen delivery system Gas      02/09/22 1022           Allergies  Allergen Reactions   Ace Inhibitors Other (See Comments)    Mouth ulcers   Lipitor [Atorvastatin] Diarrhea   Lisinopril Itching   Simvastatin Diarrhea   Corticosteroids Other (See Comments)    Patient feels flushed while taking the medication however, can tolerate it.  She has never developed a rash with the medication.   Statins Diarrhea    Follow-up Information     Yolonda Kida, MD Follow up.   Specialties: Cardiology, Internal Medicine Why: 1-2 wks Contact information: [place]  [phone]                  The results of significant diagnostics from this hospitalization (including imaging, microbiology, ancillary and laboratory) are listed below for reference.    Significant Diagnostic Studies: ECHOCARDIOGRAM COMPLETE  Result Date: 02/08/2022    ECHOCARDIOGRAM REPORT   Patient Name:   AIREONA TORELLI Whitmore Date of Exam: 02/08/2022 Medical Rec #:  [id number]     Height:       61.0 in Accession #:    [id number]    Weight:       166.0 lb Date of Birth:  [birth year]    BSA:          1.745 m Patient Age:    45 years      BP:           139/78 mmHg Patient Gender: F             HR:           72 bpm. Exam Location:  [facility] Procedure: 2D Echo, Color Doppler and Cardiac Doppler Indications:     R06.00 Dyspnea  History:         Patient has no prior history of Echocardiogram examinations.                  CAD; Risk Factors:Hypertension and Dyslipidemia.  Sonographer:     Charmayne Sheer Referring Phys:  [id number] CARMEN Veda Canning Diagnosing Phys: Isaias Cowman MD  Sonographer Comments: Suboptimal parasternal window. IMPRESSIONS  1. Left ventricular ejection fraction, by estimation, is 40 to 45%. The left ventricle has mildly decreased function. The left  ventricle has no regional wall motion abnormalities. Left ventricular diastolic parameters are consistent with Grade I diastolic dysfunction (impaired relaxation).  2. Right ventricular systolic function is normal. The right ventricular size is normal.  3. The mitral valve is normal in structure. Mild mitral valve regurgitation. No evidence of mitral stenosis.  4. The aortic valve is normal in structure. Aortic valve regurgitation is not visualized. No aortic stenosis is present.  5. The inferior vena cava is normal in size with greater than 50% respiratory variability, suggesting right atrial pressure of 3 mmHg. FINDINGS  Left Ventricle: Left ventricular ejection fraction, by estimation, is 40 to 45%. The left ventricle has mildly decreased function. The left ventricle has no regional wall motion abnormalities. The left ventricular internal cavity size was normal in size. There is no left ventricular hypertrophy. Left ventricular diastolic parameters are consistent with Grade I diastolic dysfunction (impaired relaxation). Right Ventricle: The right ventricular size is normal. No increase in right ventricular wall thickness. Right ventricular systolic function is normal. Left Atrium: Left atrial size was normal in size. Right Atrium: Right atrial size was normal in size. Pericardium: There is no evidence of pericardial effusion. Mitral Valve: The mitral valve is normal in structure. Mild mitral valve regurgitation. No evidence of mitral valve stenosis. Tricuspid Valve: The tricuspid valve is normal in structure. Tricuspid valve regurgitation is mild . No evidence of tricuspid stenosis. Aortic Valve: The aortic valve is normal in structure. Aortic valve regurgitation is not visualized. No aortic stenosis is present. Aortic valve mean gradient measures 4.0 mmHg. Aortic valve peak gradient measures 7.0 mmHg. Aortic valve area, by VTI measures 1.70 cm. Pulmonic Valve: The pulmonic valve was normal in structure.  Pulmonic valve regurgitation is not visualized. No evidence of pulmonic stenosis. Aorta: The aortic root is normal in size and structure. Venous: The  inferior vena cava is normal in size with greater than 50% respiratory variability, suggesting right atrial pressure of 3 mmHg. IAS/Shunts: No atrial level shunt detected by color flow Doppler.  LEFT VENTRICLE PLAX 2D LVIDd:         4.40 cm     Diastology LVIDs:         3.50 cm     LV e' medial:    4.79 cm/s LV PW:         1.20 cm     LV E/e' medial:  10.3 LV IVS:        1.30 cm     LV e' lateral:   6.42 cm/s LVOT diam:     1.90 cm     LV E/e' lateral: 7.7 LV SV:         43 LV SV Index:   25 LVOT Area:     2.84 cm  LV Volumes (MOD) LV vol d, MOD A2C: 78.5 ml LV vol d, MOD A4C: 80.9 ml LV vol s, MOD A2C: 59.0 ml LV vol s, MOD A4C: 60.2 ml LV SV MOD A2C:     19.5 ml LV SV MOD A4C:     80.9 ml LV SV MOD BP:      20.3 ml RIGHT VENTRICLE RV Basal diam:  3.60 cm RV S prime:     9.68 cm/s LEFT ATRIUM             Index        RIGHT ATRIUM           Index LA diam:        4.30 cm 2.46 cm/m   RA Area:     14.40 cm LA Vol (A2C):   46.4 ml 26.59 ml/m  RA Volume:   30.10 ml  17.25 ml/m LA Vol (A4C):   55.9 ml 32.04 ml/m LA Biplane Vol: 56.6 ml 32.44 ml/m  AORTIC VALVE                    PULMONIC VALVE AV Area (Vmax):    1.91 cm     PV Vmax:       1.31 m/s AV Area (Vmean):   1.73 cm     PV Vmean:      83.600 cm/s AV Area (VTI):     1.70 cm     PV VTI:        0.220 m AV Vmax:           132.00 cm/s  PV Peak grad:  6.9 mmHg AV Vmean:          85.800 cm/s  PV Mean grad:  3.0 mmHg AV VTI:            0.253 m AV Peak Grad:      7.0 mmHg AV Mean Grad:      4.0 mmHg LVOT Vmax:         89.10 cm/s LVOT Vmean:        52.400 cm/s LVOT VTI:          0.152 m LVOT/AV VTI ratio: 0.60  AORTA Ao Root diam: 3.20 cm MITRAL VALVE MV Area (PHT): 7.59 cm     SHUNTS MV Decel Time: 100 msec     Systemic VTI:  0.15 m MV E velocity: 49.30 cm/s   Systemic Diam: 1.90 cm MV A velocity: 106.00 cm/s MV  E/A ratio:  0.47 Isaias Cowman MD Electronically signed by Isaias Cowman  MD Signature Date/Time: 02/08/2022/3:35:41 PM    Final    CT Angio Chest Pulmonary Embolism (PE) W or WO Contrast  Result Date: 02/07/2022 CLINICAL DATA:  Shortness of breath, cardiomegaly EXAM: CT ANGIOGRAPHY CHEST WITH CONTRAST TECHNIQUE: Multidetector CT imaging of the chest was performed using the standard protocol during bolus administration of intravenous contrast. Multiplanar CT image reconstructions and MIPs were obtained to evaluate the vascular anatomy. RADIATION DOSE REDUCTION: This exam was performed according to the departmental dose-optimization program which includes automated exposure control, adjustment of the mA and/or kV according to patient size and/or use of iterative reconstruction technique. CONTRAST:  30m OMNIPAQUE IOHEXOL 350 MG/ML SOLN COMPARISON:  None Available. FINDINGS: Cardiovascular: Satisfactory opacification of the pulmonary arteries to the segmental level. No evidence of pulmonary embolism. Cardiomegaly. Three-vessel coronary artery calcifications. Gross enlargement of the main pulmonary artery measuring up to 4.6 cm in caliber. Small pericardial effusion. Aortic atherosclerosis. 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 Mediastinum/Nodes: No enlarged mediastinal, hilar, or axillary lymph nodes. Thyroid gland, trachea, and esophagus demonstrate no significant findings. Lungs/Pleura: Moderate centrilobular and paraseptal emphysema. Somewhat nodular, consolidative airspace opacity of the inferior right upper lobe abutting the minor fissure, measuring 2.4 x 1.9 cm, with adjacent ground-glass (series 7, image 64). 0.6 cm nodule of the anterior right upper lobe (series 7, image 51). No pleural effusion or pneumothorax. Upper Abdomen: No acute abnormality. Coarse contour of the liver. Partially imaged superior pole left renal calculus.  Musculoskeletal: No chest wall abnormality. No acute osseous findings. Review of the MIP images confirms the above findings. IMPRESSION: 1. Negative examination for pulmonary embolism. 2. Somewhat nodular, consolidative airspace opacity of the inferior right upper lobe abutting the minor fissure, measuring 2.4 x 1.9 cm, with adjacent ground-glass. Although most likely infectious or inflammatory, malignancy is not excluded. Recommend follow-up CT in 3 months to ensure resolution. 3. Additional incidental 0.6 cm nodule of the anterior right upper lobe, also possibly infectious or inflammatory. Attention on follow-up at the time of above recommended CT. 4. Cardiomegaly and coronary artery disease. 5. Gross enlargement of the main pulmonary artery, as can be seen in pulmonary hypertension. 6. Small pericardial effusion. 7. Emphysema. 8. Coarse contour of the liver, suggestive of cirrhosis. Correlate with biochemical findings. 9. Left nephrolithiasis. Aortic Atherosclerosis (ICD10-I70.0) and Emphysema (ICD10-J43.9). Electronically Signed   By: ADelanna AhmadiM.D.   On: 02/07/2022 14:32   MM 3D SCREEN BREAST BILATERAL  Result Date: 01/14/2022 CLINICAL DATA:  Screening. EXAM: DIGITAL SCREENING BILATERAL MAMMOGRAM WITH TOMOSYNTHESIS AND CAD TECHNIQUE: Bilateral screening digital craniocaudal and mediolateral oblique mammograms were obtained. Bilateral screening digital breast tomosynthesis was performed. The images were evaluated with computer-aided detection. COMPARISON:  Previous exam(s). ACR Breast Density Category b: There are scattered areas of fibroglandular density. FINDINGS: There are no findings suspicious for malignancy. IMPRESSION: No mammographic evidence of malignancy. A result letter of this screening mammogram will be mailed directly to the patient. RECOMMENDATION: Screening mammogram in one year. (Code:SM-B-01Y) BI-RADS CATEGORY  1: Negative. Electronically Signed   By: SKristopher OppenheimM.D.   On:  01/14/2022 09:29    Microbiology: No results found for this or any previous visit (from the past 240 hour(s)).   Labs: Basic Metabolic Panel: Recent Labs  Lab 02/07/22 1434 02/08/22 0429 02/09/22 0523  NA 136 140 141  K 3.5 3.1* 4.0  CL 103 107 109  CO2 '22 24 27  '$ GLUCOSE 95 131* 116*  BUN 17 15 15  CREATININE 0.91 0.80 0.74  CALCIUM 9.2 8.8* 8.9  MG  --  1.9  --    Liver Function Tests: Recent Labs  Lab 02/08/22 0429  AST 19  ALT 12  ALKPHOS 30*  BILITOT 0.8  PROT 6.7  ALBUMIN 3.5   No results for input(s): "LIPASE", "AMYLASE" in the last 168 hours. No results for input(s): "AMMONIA" in the last 168 hours. CBC: Recent Labs  Lab 02/07/22 1434 02/08/22 0429  WBC 6.3 5.9  HGB 13.8 12.8  HCT 42.6 39.9  MCV 90.8 91.5  PLT 219 199   Cardiac Enzymes: No results for input(s): "CKTOTAL", "CKMB", "CKMBINDEX", "TROPONINI" in the last 168 hours. BNP: BNP (last 3 results) Recent Labs    02/08/22 0429  BNP 72.1    ProBNP (last 3 results) No results for input(s): "PROBNP" in the last 8760 hours.  CBG: No results for input(s): "GLUCAP" in the last 168 hours.     Signed:  Desma Maxim MD.  [facility] 02/09/2022, 1:47 PM

## 2022-02-09 NOTE — Progress Notes (Signed)
SATURATION QUALIFICATIONS: (This note is used to comply with regulatory documentation for home oxygen)  Patient Saturations on Room Air at Rest = 85%  Patient Saturations on Room Air while Ambulating = 83%  Patient Saturations on 4 Liters of oxygen while Ambulating = 92%  Please briefly explain why patient needs home oxygen:

## 2022-02-12 LAB — ALPHA-1-ANTITRYPSIN PHENOTYP: A-1 Antitrypsin, Ser: 173 mg/dL (ref 101–187)

## 2022-02-14 ENCOUNTER — Encounter: Payer: Self-pay | Admitting: Pulmonary Disease

## 2022-02-14 NOTE — Telephone Encounter (Signed)
For now she needs to keep it on at all times.  We will reassess her on follow-up appointment.

## 2022-02-14 NOTE — Telephone Encounter (Signed)
You consulted on her in the hospital on 02/08/2022. She has a follow up appointment on 03/07/2022.

## 2022-03-07 ENCOUNTER — Other Ambulatory Visit
Admission: RE | Admit: 2022-03-07 | Discharge: 2022-03-07 | Disposition: A | Discharge status: Home / Self Care | Payer: Medicare HMO | Source: Ambulatory Visit | Attending: Pulmonary Disease | Admitting: Pulmonary Disease

## 2022-03-07 ENCOUNTER — Encounter: Payer: Self-pay | Admitting: Pulmonary Disease

## 2022-03-07 ENCOUNTER — Ambulatory Visit: Payer: Medicare HMO | Admitting: Pulmonary Disease

## 2022-03-07 VITALS — BP 132/82 | HR 66 | Temp 97.7°F | Ht 61.0 in | Wt 167.4 lb

## 2022-03-07 DIAGNOSIS — Z5181 Encounter for therapeutic drug level monitoring: Secondary | ICD-10-CM

## 2022-03-07 DIAGNOSIS — J984 Other disorders of lung: Secondary | ICD-10-CM | POA: Diagnosis not present

## 2022-03-07 DIAGNOSIS — J449 Chronic obstructive pulmonary disease, unspecified: Secondary | ICD-10-CM

## 2022-03-07 DIAGNOSIS — B9689 Other specified bacterial agents as the cause of diseases classified elsewhere: Secondary | ICD-10-CM

## 2022-03-07 DIAGNOSIS — J9601 Acute respiratory failure with hypoxia: Secondary | ICD-10-CM

## 2022-03-07 DIAGNOSIS — J019 Acute sinusitis, unspecified: Secondary | ICD-10-CM

## 2022-03-07 LAB — BASIC METABOLIC PANEL
Anion gap: 8 (ref 5–15)
BUN: 25 mg/dL — ABNORMAL HIGH (ref 8–23)
CO2: 29 mmol/L (ref 22–32)
Calcium: 9.1 mg/dL (ref 8.9–10.3)
Chloride: 102 mmol/L (ref 98–111)
Creatinine, Ser: 0.83 mg/dL (ref 0.44–1.00)
GFR, Estimated: >60 mL/min (ref >60)
Glucose, Bld: 121 mg/dL — ABNORMAL HIGH (ref 70–99)
Potassium: 3.6 mmol/L (ref 3.5–5.1)
Sodium: 139 mmol/L (ref 135–145)

## 2022-03-07 MED ORDER — AMOXICILLIN-POT CLAVULANATE 875-125 MG PO TABS: 1.0000 | ORAL_TABLET | Freq: Two times a day (BID) | ORAL | 0 refills | Status: AC

## 2022-03-07 NOTE — Progress Notes (Signed)
Subjective:    Patient ID: Chelsea Fitzgerald, female    DOB: 03-03-1946, 76 y.o.   MRN: [id number] Patient Care Team: Kandyce Rud, MD as PCP - General (Family Medicine) Salena Saner, MD as Consulting Physician (Pulmonary Disease)  Chief Complaint  Patient presents with   Consult    ED on 02/07/2022. SOB with exertion. Little wheezing and cough with green sputum. Nose bleeds.     HPI The patient is a 76 year old former smoker (50 PY) with a history as noted below, whom I am following after recent consultation at [facility] on 08 February 2022.  The patient was admitted to [facility] 28 February 2019 23-09 February 2022 due to shortness of breath.  She was noted to have acute respiratory failure with hypoxia.  The patient had COVID-19 diagnosed on November 26, 2021 and noticed that she had "a hard time with it".  Since then she had had increasing shortness of breath.  She also would monitor her oxygen saturations and these would go up and down to as low as 50% at home.  The patient has been treated with prednisone and antibiotics as an outpatient with some improvement of symptoms but still persistent.  A CT angio chest performed 9 November showed no pulmonary embolism, some nodular/consolidative airspace opacities on the right upper lobe and adjacent groundglass and an incidental 6 mm nodule in the anterior right upper lobe which could also be infectious/inflammatory.  There is enlargement of the main pulmonary artery which could indicate pulmonary hypertension and a small pericardial effusion.  Of note the patient has significant emphysema noted on CT as well.  For the details of these findings please see my consultation note of 08 February 2022.  The patient was discharged on 4 L/min of oxygen via nasal cannula.  She was also discharged on Breo Ellipta 100 and Incruse Ellipta inhalers.  She is also on a prednisone taper.  As noted she presents today for follow-up since her discharge she has had  shortness of breath with exertion.  Today she notes that she has had cough productive of greenish sputum as well as significant sinus congestion and epistaxis.  She feels that her ears are "stopped up".  She is compliant with the Breo Ellipta and Incruse and is using as needed albuterol 2-3 times a week.  She notes some improvement with these medications but still with significant dyspnea.  She does not endorse any fevers, chills or sweats.  She has noted minimal amount of wheezing particularly with cough.  No lower extremity edema, no calf tenderness.   Review of Systems A 10 point review of systems was performed and it is as noted above otherwise negative.  Patient Active Problem List   Diagnosis Date Noted   Acute respiratory failure with hypoxia (HCC) 02/07/2022   Abnormal CT scan, chest 02/07/2022   Acute respiratory failure with hypoxemia (HCC) 02/07/2022   Cecal polyp 06/21/2021   Hx of adenomatous colonic polyps 06/21/2021   Abnormal colonoscopy 06/21/2021   Adenomatous polyp of ascending colon    Adenoma of descending colon    Type 2 diabetes mellitus with peripheral neuropathy (HCC) 05/05/2020   Ischemic cardiomyopathy 02/15/2020   OA (osteoarthritis) of knee 01/18/2019   Carotid disease, bilateral (HCC) 01/23/2016   Essential hypertension 01/23/2016   Generalized anxiety disorder 09/14/2013   Impaired fasting glucose 09/14/2013   Hematuria 06/09/2012   CAD (coronary artery disease) 01/23/2012   Social History   Tobacco Use   Smoking  status: Former    Packs/day: 1    Types: Cigarettes    Quit date: 01/22/2012    Years since quitting: 10.6   Smokeless tobacco: Never   Tobacco comments:    50   Substance Use Topics   Alcohol use: Yes    Comment: occas   Allergies  Allergen Reactions   Ace Inhibitors Other (See Comments)    Mouth ulcers   Lipitor [Atorvastatin] Diarrhea   Lisinopril Itching   Simvastatin Diarrhea   Corticosteroids Other (See Comments)     Patient feels flushed while taking the medication however, can tolerate it.  She has never developed a rash with the medication.   Statins Diarrhea   Current Meds  Medication Sig   albuterol (VENTOLIN HFA) 108 (90 Base) MCG/ACT inhaler INHALE 2 PUFFS BY MOUTH EVERY 6 HOURS AS NEEDED FOR WHEEZE   ALPRAZolam (XANAX) 0.5 MG tablet Take 0.25 mg by mouth at bedtime.    [EXPIRED] amoxicillin-clavulanate (AUGMENTIN) 875-125 MG tablet Take 1 tablet by mouth 2 (two) times daily for 7 days.   citalopram (CELEXA) 20 MG tablet Take 20 mg by mouth daily.    fluticasone (FLONASE) 50 MCG/ACT nasal spray Place 2 sprays into both nostrils daily.    fluticasone furoate-vilanterol (BREO ELLIPTA) 100-25 MCG/ACT AEPB Inhale 1 puff into the lungs daily.   furosemide (LASIX) 20 MG tablet Take 20 mg by mouth daily.   gabapentin (NEURONTIN) 300 MG capsule Take 300-600 mg by mouth See admin instructions. 300 mg in morning, 300 mg in afternoon and 600 mg  at night   Melatonin 3 MG TBDP Take 3 mg by mouth at bedtime.   metoprolol succinate (TOPROL-XL) 50 MG 24 hr tablet Take 50 mg by mouth daily.    MISCELLANEOUS VAGINAL PRODUCTS [facility] Place 1 application  vaginally at bedtime. INSERT 4 CLICKS (= ) VAGINALLY AT BEDTIME TWO TIMES PER WEEK -REFILLS 48HR NOTICE - CUSTOM COMPOUND   montelukast (SINGULAIR) 10 MG tablet Take 10 mg by mouth at bedtime.   predniSONE (DELTASONE) 10 MG tablet 2 tabs daily for 6 days, then 1 tab daily   rosuvastatin (CRESTOR) 5 MG tablet Take 5 mg by mouth daily.   sacubitril-valsartan (ENTRESTO) 24-26 MG Take 1 tablet by mouth 2 (two) times daily.   spironolactone (ALDACTONE) 25 MG tablet Take 12.5 mg by mouth 2 (two) times daily.   umeclidinium bromide (INCRUSE ELLIPTA) 62.5 MCG/ACT AEPB Inhale 1 puff into the lungs daily.   Immunization History  Administered Date(s) Administered   Fluad Quad(high Dose 65+) 01/30/2022   PNEUMOCOCCAL CONJUGATE-20 01/30/2022        Objective:   Physical  Exam BP 132/82 (BP Location: Left Arm, Cuff Size: Normal)   Pulse 66   Temp 97.7 F (36.5 C)   Ht 5\' 1"  (1.549 m)   Wt 167 lb 6.4 oz (75.9 kg)   SpO2 98%   BMI 31.63 kg/m   SpO2: 98 % O2 Device: Nasal cannula O2 Flow Rate (L/min): 4 L/min  GENERAL: Obese woman, no acute distress, laying comfortably in bed, no tachypnea on nasal cannula.  No conversational dyspnea. HEAD: Normocephalic, atraumatic.  EYES: Pupils equal, round, reactive to light.  No scleral icterus.  MOUTH: Intact dentition, oral mucosa moist, pharynx clear. NECK: Supple. No thyromegaly. Trachea midline. No JVD.  No adenopathy. PULMONARY: Good air entry bilaterally.  Coarse, otherwise, no adventitious sounds. CHEST WALL: Mild tenderness along the left costochondral junction. CARDIOVASCULAR: S1 and S2. Regular rate and rhythm.  No rubs, murmurs or gallops heard. ABDOMEN: Obese,, normoactive bowel sounds, no tenderness, benign.  No hepatosplenomegaly. MUSCULOSKELETAL: No joint deformity, no clubbing, no edema.  NEUROLOGIC: No overt focal deficit. SKIN: Intact,warm,dry. PSYCH: Mood and behavior normal.  Ambulatory oximetry was performed today: At rest on room air oxygen saturation was 98%, the patient ambulated at a moderate pace, completed 2 laps, O2 nadir 86%, mild shortness of breath.  The patient then had oxygen at 2 L/min applied and was able to ambulate maintaining oxygen saturations at 90% or better.  Resting heart rate was 67 bpm at maximum for this exercise 93 bpm.  The oximetry was performed for titration of oxygen.  The patient can decrease oxygen to 2 L/min.      Assessment & Plan:     ICD-10-CM   1. Acute respiratory failure with hypoxia (HCC)  J96.01 Pulmonary Function Test [facility] Only    AMB REFERRAL FOR DME    CANCELED: AMB REFERRAL FOR DME   Continues to require oxygen Oxygen titrated to 2 L/min via nasal cannula    2. COPD suggested by initial evaluation (HCC)  J44.9 Pulmonary Function Test [facility]  Only    AMB REFERRAL FOR DME    CANCELED: AMB REFERRAL FOR DME   Will obtain PFTs Continue Breo and Incruse for now Continue as needed albuterol    3. Pneumonitis  J98.4 CT CHEST WO CONTRAST   Will reassess with CT chest Likely sequela from COVID-19    4. Acute bacterial sinusitis  J01.90    B96.89    Augmentin 875 twice daily x 7 days Nasal hygiene Discontinue Flonase due to epistaxis    5. Medication monitoring encounter  Z51.81 Basic Metabolic Panel (BMET)    CANCELED: Basic Metabolic Panel (BMET)   Patient has been on steroids Checking glucose/electrolytes     Orders Placed This Encounter  Procedures   CT CHEST WO CONTRAST    In 2 weeks    Standing Status:   Future    Standing Expiration Date:   03/08/2023    Order Specific Question:   Preferred imaging location?    Answer:   [facility]   Basic Metabolic Panel (BMET)    Standing Status:   Future    Number of Occurrences:   1    Standing Expiration Date:   03/08/2023   AMB REFERRAL FOR DME    Referral Priority:   Routine    Referral Type:   Durable Medical Equipment Purchase    Number of Visits Requested:   1   Pulmonary Function Test [facility] Only    Standing Status:   Future    Standing Expiration Date:   03/08/2023    Order Specific Question:   Full PFT: includes the following: basic spirometry, spirometry pre & post bronchodilator, diffusion capacity (DLCO), lung volumes    Answer:   Full PFT    Order Specific Question:   This test can only be performed at    Answer:   [facility]   Meds ordered this encounter  Medications   amoxicillin-clavulanate (AUGMENTIN) 875-125 MG tablet    Sig: Take 1 tablet by mouth 2 (two) times daily for 7 days.    Dispense:  14 tablet    Refill:  0   Will see the patient in follow-up in 4 to 6 weeks time call sooner should any new problems arise.  Gailen Shelter, MD Advanced Bronchoscopy PCC[facility]    *This note was dictated using  voice  recognition software/Dragon.  Despite best efforts to proofread, errors can occur which can change the meaning. Any transcriptional errors that result from this process are unintentional and may not be fully corrected at the time of dictation.

## 2022-03-07 NOTE — Patient Instructions (Addendum)
Do not use the Flonase given that you are having nosebleeds.  You can use nasal saline spray or nasal saline gel to help with the dryness of the nose.  Good brand over-the-counter is AYR.  We have sent in an antibiotic for sinus infection.  We are checking blood work to check on your sugar.  You can cut the prednisone tablet in half and take a half a tablet every day (5 mg).  Do this for the next 7 tablets.  Then after that half a tablet every other day until they are all gone.  We have ordered another CT scan of the chest to follow-up on the findings that you had when you were admitted to the hospital.  Your oxygen requirement is down to 2 L/min which is good news.  We have sent the request to [employer] for evaluation for more portable source of oxygen as well as for a humidifier bottle for your concentrator.  We will get breathing tests to establish a baseline for your lung function.  We will see you in follow-up in 4 to 6 weeks time call sooner should any new problems arise.

## 2022-03-14 ENCOUNTER — Telehealth: Payer: Self-pay | Admitting: Pulmonary Disease

## 2022-03-14 ENCOUNTER — Encounter: Payer: Self-pay | Admitting: Pulmonary Disease

## 2022-03-14 NOTE — Telephone Encounter (Addendum)
The question about continuing her Incruse was discussed in a MyChart message from today.  Rodena Piety, will you contact [employer] about the POC? Thank you!

## 2022-03-14 NOTE — Telephone Encounter (Signed)
Pt calling for refill for Incuruse given to her in the hosp. Not sure if she was to continue using it after 1st bottle. 2 puffs left.  Also having issues w/[employer] refilling port 02. They state they do not have the right info. She has already done a "walk" she said.  Pharm [facility] at [address].  Pls contact PT to advise. [phone]

## 2022-03-14 NOTE — Telephone Encounter (Signed)
Sent urgent message to [employer] to check on this issue

## 2022-03-15 ENCOUNTER — Telehealth: Payer: Self-pay | Admitting: Pulmonary Disease

## 2022-03-15 MED ORDER — TRELEGY ELLIPTA 100-62.5-25 MCG/ACT IN AEPB: 1.0000 | INHALATION_SPRAY | Freq: Every day | RESPIRATORY_TRACT | 0 refills | Status: DC

## 2022-03-15 NOTE — Telephone Encounter (Signed)
She was discharged on Breo and Incruse.  We can switch her to Trelegy Ellipta 100 which is both of those together.  This will simplify her regimen.  Once she starts the Trelegy she can up the Breo and the Incruse.  We can provide her with samples to see how she does with that.

## 2022-03-15 NOTE — Telephone Encounter (Signed)
New order has been placed with the new wording. The CM was sent to [employer] again 03/14/22

## 2022-03-15 NOTE — Telephone Encounter (Signed)
Should she continue the Incruse Inhaler?

## 2022-03-15 NOTE — Addendum Note (Signed)
Addended by: Fabian Sharp H on: 03/15/2022 12:00 PM   Modules accepted: Orders

## 2022-03-15 NOTE — Telephone Encounter (Signed)
I spoke with the patient. I told her [employer] will have to walk her on a POC machine. She understands and is ok with doing the walk at [facility]. Nothing further needed.

## 2022-03-21 ENCOUNTER — Ambulatory Visit
Admission: RE | Admit: 2022-03-21 | Discharge: 2022-03-21 | Disposition: A | Discharge status: Home / Self Care | Payer: Medicare HMO | Source: Ambulatory Visit | Attending: Pulmonary Disease | Admitting: Pulmonary Disease

## 2022-03-21 DIAGNOSIS — J984 Other disorders of lung: Secondary | ICD-10-CM | POA: Diagnosis present

## 2022-03-27 ENCOUNTER — Other Ambulatory Visit: Payer: Self-pay

## 2022-03-27 DIAGNOSIS — R911 Solitary pulmonary nodule: Secondary | ICD-10-CM

## 2022-04-15 ENCOUNTER — Encounter: Payer: Self-pay | Admitting: Gastroenterology

## 2022-04-20 ENCOUNTER — Other Ambulatory Visit: Payer: Self-pay | Admitting: Pulmonary Disease

## 2022-04-24 ENCOUNTER — Ambulatory Visit: Payer: Medicare HMO | Admitting: Pulmonary Disease

## 2022-04-25 ENCOUNTER — Ambulatory Visit: Payer: Medicare HMO | Attending: Pulmonary Disease

## 2022-04-25 DIAGNOSIS — J449 Chronic obstructive pulmonary disease, unspecified: Secondary | ICD-10-CM | POA: Diagnosis not present

## 2022-04-25 DIAGNOSIS — J9601 Acute respiratory failure with hypoxia: Secondary | ICD-10-CM

## 2022-04-25 DIAGNOSIS — Z87891 Personal history of nicotine dependence: Secondary | ICD-10-CM | POA: Diagnosis not present

## 2022-04-25 LAB — PULMONARY FUNCTION TEST ARMC ONLY
DL/VA % pred: 47 %
DL/VA: 1.97 ml/min/mmHg/L
DLCO unc % pred: 36 %
DLCO unc: 6.23 ml/min/mmHg
FEF 25-75 Post: 1.99 L/sec
FEF 25-75 Pre: 2.1 L/sec
FEF2575-%Change-Post: -5 %
FEF2575-%Pred-Post: 143 %
FEF2575-%Pred-Pre: 150 %
FEV1-%Change-Post: 1 %
FEV1-%Pred-Post: 90 %
FEV1-%Pred-Pre: 89 %
FEV1-Post: 1.59 L
FEV1-Pre: 1.57 L
FEV1FVC-%Change-Post: 0 %
FEV1FVC-%Pred-Pre: 112 %
FEV6-%Change-Post: 0 %
FEV6-%Pred-Post: 83 %
FEV6-%Pred-Pre: 83 %
FEV6-Post: 1.89 L
FEV6-Pre: 1.87 L
FEV6FVC-%Pred-Post: 105 %
FEV6FVC-%Pred-Pre: 105 %
FVC-%Change-Post: 0 %
FVC-%Pred-Post: 79 %
FVC-%Pred-Pre: 78 %
FVC-Post: 1.89 L
FVC-Pre: 1.87 L
Post FEV1/FVC ratio: 85 %
Post FEV6/FVC ratio: 100 %
Pre FEV1/FVC ratio: 84 %
Pre FEV6/FVC Ratio: 100 %
RV % pred: 48 %
RV: 1.06 L
TLC % pred: 65 %
TLC: 3.03 L

## 2022-04-25 MED ORDER — ALBUTEROL SULFATE (2.5 MG/3ML) 0.083% IN NEBU
2.5000 mg | INHALATION_SOLUTION | Freq: Once | RESPIRATORY_TRACT | Status: AC
  Administered 2022-04-25: 2.5 mg via RESPIRATORY_TRACT

## 2022-05-03 ENCOUNTER — Ambulatory Visit: Payer: Medicare HMO | Admitting: Pulmonary Disease

## 2022-05-03 ENCOUNTER — Encounter: Payer: Self-pay | Admitting: Pulmonary Disease

## 2022-05-03 VITALS — BP 110/60 | HR 75 | Temp 97.6°F | Ht 61.0 in | Wt 165.2 lb

## 2022-05-03 DIAGNOSIS — J449 Chronic obstructive pulmonary disease, unspecified: Secondary | ICD-10-CM

## 2022-05-03 DIAGNOSIS — J849 Interstitial pulmonary disease, unspecified: Secondary | ICD-10-CM | POA: Diagnosis not present

## 2022-05-03 DIAGNOSIS — H6121 Impacted cerumen, right ear: Secondary | ICD-10-CM

## 2022-05-03 DIAGNOSIS — J9611 Chronic respiratory failure with hypoxia: Secondary | ICD-10-CM | POA: Diagnosis not present

## 2022-05-03 NOTE — Progress Notes (Signed)
Subjective:    Patient ID: Chelsea Fitzgerald, female    DOB: [birth year], 77 y.o.   MRN: [id number] Patient Care Team: Derinda Late, MD as PCP - General (Family Medicine)  Chief Complaint  Patient presents with  . Follow-up    SOB with exertion. Wheezing off and on. Right ear congestion for 1 week. Occasional cough with yellow sputum.    HPI    Review of Systems A 10 point review of systems was performed and it is as noted above otherwise negative.  Patient Active Problem List   Diagnosis Date Noted  . Acute respiratory failure with hypoxia ([place]) 02/07/2022  . Abnormal CT scan, chest 02/07/2022  . Acute respiratory failure with hypoxemia ([place]) 02/07/2022  . Cecal polyp 06/21/2021  . Hx of adenomatous colonic polyps 06/21/2021  . Abnormal colonoscopy 06/21/2021  . Adenomatous polyp of ascending colon   . Adenoma of descending colon   . Type 2 diabetes mellitus with peripheral neuropathy ([place]) 05/05/2020  . Ischemic cardiomyopathy 02/15/2020  . OA (osteoarthritis) of knee 01/18/2019  . Carotid disease, bilateral ([place]) 01/23/2016  . Essential hypertension 01/23/2016  . Generalized anxiety disorder 09/14/2013  . Impaired fasting glucose 09/14/2013  . Hematuria 06/09/2012  . CAD (coronary artery disease) 01/23/2012   Social History   Tobacco Use  . Smoking status: Former    Packs/day: 1.00    Types: Cigarettes    Quit date: 01/22/2012    Years since quitting: 10.2  . Smokeless tobacco: Never  . Tobacco comments:    50   Substance Use Topics  . Alcohol use: Yes    Comment: occas   Allergies  Allergen Reactions  . Ace Inhibitors Other (See Comments)    Mouth ulcers  . Lipitor [Atorvastatin] Diarrhea  . Lisinopril Itching  . Simvastatin Diarrhea  . Corticosteroids Other (See Comments)    Patient feels flushed while taking the medication however, can tolerate it.  She has never developed a rash with the medication.  . Statins Diarrhea   Current Meds   Medication Sig  . albuterol (VENTOLIN HFA) 108 (90 Base) MCG/ACT inhaler INHALE 2 PUFFS BY MOUTH EVERY 6 HOURS AS NEEDED FOR WHEEZE  . ALPRAZolam (XANAX) 0.5 MG tablet Take 0.25 mg by mouth at bedtime.   . citalopram (CELEXA) 20 MG tablet Take 20 mg by mouth daily.   . fluticasone (FLONASE) 50 MCG/ACT nasal spray Place 2 sprays into both nostrils daily.   . Fluticasone-Umeclidin-Vilant (TRELEGY ELLIPTA) 100-62.5-25 MCG/ACT AEPB TAKE 1 PUFF BY MOUTH EVERY DAY  . furosemide (LASIX) 20 MG tablet Take 20 mg by mouth daily.  Marland Kitchen gabapentin (NEURONTIN) 300 MG capsule Take 300-600 mg by mouth See admin instructions. 300 mg in morning, 300 mg in afternoon and 600 mg  at night  . loratadine (CLARITIN) 10 MG tablet Take 10 mg by mouth daily as needed.  . Melatonin 3 MG TBDP Take 3 mg by mouth at bedtime.  . metoprolol succinate (TOPROL-XL) 50 MG 24 hr tablet Take 50 mg by mouth daily.   Marland Kitchen MISCELLANEOUS VAGINAL PRODUCTS [facility] Place 1 application  vaginally at bedtime. INSERT 4 CLICKS (= 1ML) VAGINALLY AT BEDTIME TWO TIMES PER WEEK -REFILLS 48HR NOTICE - CUSTOM COMPOUND  . montelukast (SINGULAIR) 10 MG tablet Take 10 mg by mouth at bedtime.  . rosuvastatin (CRESTOR) 5 MG tablet Take 5 mg by mouth daily.  . sacubitril-valsartan (ENTRESTO) 24-26 MG Take 1 tablet by mouth 2 (two) times daily.  Marland Kitchen spironolactone (ALDACTONE)  25 MG tablet Take 12.5 mg by mouth 2 (two) times daily.   Immunization History  Administered Date(s) Administered  . Fluad Quad(high Dose 65+) 01/30/2022  . PNEUMOCOCCAL CONJUGATE-20 01/30/2022       Objective:   Physical Exam BP 110/60 (BP Location: Left Arm, Cuff Size: Normal)   Pulse 75   Temp 97.6 F (36.4 C)   Ht '5\' 1"'$  (1.549 m)   Wt 165 lb 3.2 oz (74.9 kg)   SpO2 97%   BMI 31.21 kg/m   SpO2: 97 % O2 Device: Nasal cannula O2 Flow Rate (L/min): 3 L/min  GENERAL: Obese woman, no acute distress, laying comfortably in bed, no tachypnea on nasal cannula.  No conversational  dyspnea. HEAD: Normocephalic, atraumatic.  EYES: Pupils equal, round, reactive to light.  No scleral icterus.  MOUTH: Intact dentition, oral mucosa moist, pharynx clear. NECK: Supple. No thyromegaly. Trachea midline. No JVD.  No adenopathy. PULMONARY: Good air entry bilaterally.  Coarse, otherwise, no adventitious sounds. CHEST WALL: Mild tenderness along the left costochondral junction. CARDIOVASCULAR: S1 and S2. Regular rate and rhythm.  No rubs, murmurs or gallops heard. ABDOMEN: Obese,, normoactive bowel sounds, no tenderness, benign.  No hepatosplenomegaly. MUSCULOSKELETAL: No joint deformity, no clubbing, no edema.  NEUROLOGIC: No overt focal deficit. SKIN: Intact,warm,dry. PSYCH: Mood and behavior normal.          Assessment & Plan:

## 2022-05-03 NOTE — Patient Instructions (Signed)
You did well with your walking test today but you still require some oxygen.  You can decrease this to 2 L/min.  Continue taking your Trelegy.  Continue your rescue inhaler if needed.  We are going to do a specialized CT scan of the chest on the first or second week of March, you will be called closer to the time of scheduling.  For your wax buildup in your ear you can use Debrox over-the-counter.  Follow the directions.  If this does not clear it let us know you may need an ENT referral.  We will see you in follow-up on the third or fourth week of March after that CT is done.  We will reassess your oxygen needs at that time.  Call sooner should any new problems arise.

## 2022-05-04 ENCOUNTER — Encounter: Payer: Self-pay | Admitting: Pulmonary Disease

## 2022-06-06 ENCOUNTER — Encounter: Payer: Self-pay | Admitting: Pulmonary Disease

## 2022-06-06 ENCOUNTER — Encounter: Payer: Self-pay | Admitting: Gastroenterology

## 2022-06-06 NOTE — Telephone Encounter (Signed)
Anita, please advise. Thanks 

## 2022-06-07 NOTE — Telephone Encounter (Signed)
I have spoke with Mrs. Fiel and her CT has been scheduled on 06/24/22 @ 11:40am and her ROV has been scheduled on 07/11/22 at 10:30am with Dr. Patsey Berthold

## 2022-06-11 NOTE — Telephone Encounter (Signed)
Chelsea Fitzgerald, I would reach out to the patient's cardiologist and pulmonologist to get procedure optimization in case they feel it is needed. Otherwise she can be scheduled. If she has concerns and wants to see me in clinic before deciding upon doing another colonoscopy, she can also be scheduled with me. Let me know what she decides. Thanks. GM

## 2022-06-24 ENCOUNTER — Ambulatory Visit
Admission: RE | Admit: 2022-06-24 | Discharge: 2022-06-24 | Disposition: A | Discharge status: Home / Self Care | Payer: Medicare HMO | Source: Ambulatory Visit | Attending: Pulmonary Disease | Admitting: Pulmonary Disease

## 2022-06-24 DIAGNOSIS — J849 Interstitial pulmonary disease, unspecified: Secondary | ICD-10-CM | POA: Diagnosis present

## 2022-06-26 ENCOUNTER — Other Ambulatory Visit: Payer: Self-pay

## 2022-06-26 DIAGNOSIS — J849 Interstitial pulmonary disease, unspecified: Secondary | ICD-10-CM

## 2022-07-02 ENCOUNTER — Other Ambulatory Visit
Admission: RE | Admit: 2022-07-02 | Discharge: 2022-07-02 | Disposition: A | Discharge status: Home / Self Care | Payer: Medicare HMO | Attending: Pulmonary Disease | Admitting: Pulmonary Disease

## 2022-07-02 DIAGNOSIS — J849 Interstitial pulmonary disease, unspecified: Secondary | ICD-10-CM | POA: Insufficient documentation

## 2022-07-02 NOTE — Addendum Note (Signed)
Addended by: Marty Heck on: 07/02/2022 09:59 AM   Modules accepted: Orders

## 2022-07-05 ENCOUNTER — Emergency Department: Payer: Medicare HMO

## 2022-07-05 ENCOUNTER — Encounter: Payer: Self-pay | Admitting: Pulmonary Disease

## 2022-07-05 ENCOUNTER — Emergency Department
Admission: EM | Admit: 2022-07-05 | Discharge: 2022-07-05 | Disposition: A | Discharge status: Home / Self Care | Payer: Medicare HMO | Attending: Emergency Medicine | Admitting: Emergency Medicine

## 2022-07-05 DIAGNOSIS — I10 Essential (primary) hypertension: Secondary | ICD-10-CM | POA: Diagnosis not present

## 2022-07-05 DIAGNOSIS — R0602 Shortness of breath: Secondary | ICD-10-CM | POA: Diagnosis present

## 2022-07-05 DIAGNOSIS — I2510 Atherosclerotic heart disease of native coronary artery without angina pectoris: Secondary | ICD-10-CM | POA: Diagnosis not present

## 2022-07-05 DIAGNOSIS — J9611 Chronic respiratory failure with hypoxia: Secondary | ICD-10-CM | POA: Diagnosis not present

## 2022-07-05 DIAGNOSIS — J441 Chronic obstructive pulmonary disease with (acute) exacerbation: Secondary | ICD-10-CM | POA: Diagnosis not present

## 2022-07-05 DIAGNOSIS — Z1152 Encounter for screening for COVID-19: Secondary | ICD-10-CM | POA: Diagnosis not present

## 2022-07-05 LAB — COMPREHENSIVE METABOLIC PANEL
ALT: 12 U/L (ref 0–44)
AST: 17 U/L (ref 15–41)
Albumin: 3.8 g/dL (ref 3.5–5.0)
Alkaline Phosphatase: 39 U/L (ref 38–126)
Anion gap: 12 (ref 5–15)
BUN: 19 mg/dL (ref 8–23)
CO2: 25 mmol/L (ref 22–32)
Calcium: 9.7 mg/dL (ref 8.9–10.3)
Chloride: 101 mmol/L (ref 98–111)
Creatinine, Ser: 0.81 mg/dL (ref 0.44–1.00)
GFR, Estimated: >60 mL/min (ref >60)
Glucose, Bld: 113 mg/dL — ABNORMAL HIGH (ref 70–99)
Potassium: 3.8 mmol/L (ref 3.5–5.1)
Sodium: 138 mmol/L (ref 135–145)
Total Bilirubin: 0.6 mg/dL (ref 0.3–1.2)
Total Protein: 7.3 g/dL (ref 6.5–8.1)

## 2022-07-05 LAB — HYPERSENSITIVITY PNEUMONITIS
A. Pullulans Abs: NEGATIVE
A.Fumigatus #1 Abs: NEGATIVE
Micropolyspora faeni, IgG: NEGATIVE
Pigeon Serum Abs: NEGATIVE
Thermoact. Saccharii: NEGATIVE
Thermoactinomyces vulgaris, IgG: NEGATIVE

## 2022-07-05 LAB — CBC WITH DIFFERENTIAL/PLATELET
Abs Immature Granulocytes: 0.03 10*3/uL (ref 0.00–0.07)
Basophils Absolute: 0 10*3/uL (ref 0.0–0.1)
Basophils Relative: 0 %
Eosinophils Absolute: 0.3 10*3/uL (ref 0.0–0.5)
Eosinophils Relative: 3 %
HCT: 41.4 % (ref 36.0–46.0)
Hemoglobin: 12.9 g/dL (ref 12.0–15.0)
Immature Granulocytes: 0 %
Lymphocytes Relative: 10 %
Lymphs Abs: 1 10*3/uL (ref 0.7–4.0)
MCH: 30.3 pg (ref 26.0–34.0)
MCHC: 31.2 g/dL (ref 30.0–36.0)
MCV: 97.2 fL (ref 80.0–100.0)
Monocytes Absolute: 0.6 10*3/uL (ref 0.1–1.0)
Monocytes Relative: 5 %
Neutro Abs: 8.5 10*3/uL — ABNORMAL HIGH (ref 1.7–7.7)
Neutrophils Relative %: 82 %
Platelets: 247 10*3/uL (ref 150–400)
RBC: 4.26 MIL/uL (ref 3.87–5.11)
RDW: 13 % (ref 11.5–15.5)
WBC: 10.4 10*3/uL (ref 4.0–10.5)
nRBC: 0 % (ref 0.0–0.2)

## 2022-07-05 LAB — SARS CORONAVIRUS 2 BY RT PCR: SARS Coronavirus 2 by RT PCR: NEGATIVE

## 2022-07-05 MED ORDER — DOXYCYCLINE HYCLATE 100 MG PO CAPS: 100.0000 mg | ORAL_CAPSULE | Freq: Two times a day (BID) | ORAL | 0 refills | Status: AC

## 2022-07-05 MED ORDER — PREDNISONE 20 MG PO TABS: 40.0000 mg | ORAL_TABLET | Freq: Every day | ORAL | 0 refills | Status: AC

## 2022-07-05 NOTE — Telephone Encounter (Signed)
I recommend urgent care or ED as it looks like she may need imaging she may be dealing with pneumonia.  So do all the viral testing.  Of had a resurgence of COVID on the last few weeks.

## 2022-07-05 NOTE — ED Provider Notes (Signed)
[facility] Provider Note    Event Date/Time   First MD Initiated Contact with Patient 07/05/22 1511     (approximate)   History   Chief Complaint: Shortness of Breath   HPI  Chelsea Fitzgerald is a 77 y.o. female with a history of hypertension, emphysema, CAD, interstitial lung disease who comes to the ED for evaluation due to malaise and increased shortness of breath over the past week.  She notes that she gets particularly short of breath when she is walking and finds that her oxygen level sometimes decreases down to about 85% with ambulation on her 2 L nasal cannula.  She has increased her oxygen to 3 L to feel better.  She is also having increased cough with green sputum production.  No fever but does report some chills.  No chest pain.  She has an appointment with her pulmonologist Dr. Jayme CloudGonzalez in 6 days.  Wells criteria negative  Records reviewed moving from CT chest on June 24, 2022 that patient has underlying pulmonary fibrosis and emphysema and that chronic disease has been progressive over the last few months.     Physical Exam   Triage Vital Signs: ED Triage Vitals [07/05/22 1258]  Enc Vitals Group     BP (!) 140/78     Pulse Rate 65     Resp 18     Temp 97.6 F (36.4 C)     Temp Source Oral     SpO2 95 %     Weight 165 lb (74.8 kg)     Height 5\' 1"  (1.549 m)     Head Circumference      Peak Flow      Pain Score 2     Pain Loc      Pain Edu?      Excl. in GC?     Most recent vital signs: Vitals:   07/05/22 1258  BP: (!) 140/78  Pulse: 65  Resp: 18  Temp: 97.6 F (36.4 C)  SpO2: 95%    General: Awake, no distress.  CV:  Good peripheral perfusion.  Regular rate and rhythm.  Symmetric distal pulses Resp:  Normal effort.  Coarse breath sounds diffusely, worse in the bases, no focal abnormalities Abd:  No distention.  Other:  No lower extremity edema, no calf tenderness.   ED Results / Procedures / Treatments   Labs (all  labs ordered are listed, but only abnormal results are displayed) Labs Reviewed  CBC WITH DIFFERENTIAL/PLATELET - Abnormal; Notable for the following components:      Result Value   Neutro Abs 8.5 (*)    All other components within normal limits  COMPREHENSIVE METABOLIC PANEL - Abnormal; Notable for the following components:   Glucose, Bld 113 (*)    All other components within normal limits  SARS CORONAVIRUS 2 BY RT PCR     EKG Interpreted by me Sinus rhythm rate of 72.  Left axis, left bundle branch block.  No acute ischemic changes.   RADIOLOGY Chest x-ray interpreted by me, shows streaky haziness in the bases consistent with interstitial lung disease.  Radiology report reviewed   PROCEDURES:  Procedures   MEDICATIONS ORDERED IN ED: Medications - No data to display   IMPRESSION / MDM / ASSESSMENT AND PLAN / ED COURSE  I reviewed the triage vital signs and the nursing notes.  DDx: COPD exacerbation,, pleural effusion, pulmonary edema, electrolyte abnormality, anemia, AKI, COVID  Patient's presentation is most consistent  with acute presentation with potential threat to life or bodily function.  Patient on chronic oxygen reports increased dyspnea on exertion.  Vital signs are normal in the ED.  No chest pain.  Doubt ACS PE dissection pericardial effusion or carditis.  Labs EKG and chest x-ray are nonacute and overall unremarkable.  I think the chest x-ray is likely reflecting its chronic appearance based on recent CT scans which showed progressed interstitial lung disease.  Will start her on a course of prednisone and doxycycline, and she stable to follow-up with her pulmonologist next week as scheduled.       FINAL CLINICAL IMPRESSION(S) / ED DIAGNOSES   Final diagnoses:  COPD exacerbation  Chronic respiratory failure with hypoxia     Rx / DC Orders   ED Discharge Orders          Ordered    predniSONE (DELTASONE) 20 MG tablet  Daily with breakfast         07/05/22 1532    doxycycline (VIBRAMYCIN) 100 MG capsule  2 times daily        07/05/22 1532             Note:  This document was prepared using Dragon voice recognition software and may include unintentional dictation errors.   Sharman Cheek, MD 07/05/22 [phone]

## 2022-07-05 NOTE — ED Triage Notes (Signed)
Pt presents to the ED via POV due to increase demand on home O2. Pt usually wears 2L [place] and now needing 3L. Pt states she had a CT completed about a month ago that showed some concerns.Pt NAD A&Ox4

## 2022-07-05 NOTE — ED Notes (Signed)
D/C, new RX's and reasons to return to discussed with pt, pt verbalized understanding.

## 2022-07-11 ENCOUNTER — Ambulatory Visit (INDEPENDENT_AMBULATORY_CARE_PROVIDER_SITE_OTHER): Payer: Medicare HMO | Admitting: Pulmonary Disease

## 2022-07-11 ENCOUNTER — Encounter: Payer: Self-pay | Admitting: Pulmonary Disease

## 2022-07-11 VITALS — BP 138/84 | HR 58 | Temp 97.3°F | Ht 61.0 in | Wt 166.8 lb

## 2022-07-11 DIAGNOSIS — J439 Emphysema, unspecified: Secondary | ICD-10-CM

## 2022-07-11 DIAGNOSIS — J849 Interstitial pulmonary disease, unspecified: Secondary | ICD-10-CM

## 2022-07-11 DIAGNOSIS — J9611 Chronic respiratory failure with hypoxia: Secondary | ICD-10-CM

## 2022-07-11 DIAGNOSIS — J4489 Other specified chronic obstructive pulmonary disease: Secondary | ICD-10-CM | POA: Diagnosis not present

## 2022-07-11 DIAGNOSIS — R0602 Shortness of breath: Secondary | ICD-10-CM

## 2022-07-11 LAB — NITRIC OXIDE: Nitric Oxide: 22

## 2022-07-11 MED ORDER — TRELEGY ELLIPTA 200-62.5-25 MCG/ACT IN AEPB: 1.0000 | INHALATION_SPRAY | Freq: Every day | RESPIRATORY_TRACT | 0 refills | Status: DC

## 2022-07-11 NOTE — Patient Instructions (Signed)
We are giving you a trial of the increased dose of Trelegy, 1 puff daily, make sure you rinse your mouth well after you use it.  This dose is the 200 mcg dose.  Let us know how you do with this.  You have enough for a month on the samples.  We will see you in follow-up in 3 to 4 weeks time call sooner should any new problems arise.

## 2022-07-11 NOTE — Progress Notes (Signed)
Subjective:    Patient ID: Chelsea Fitzgerald, female    DOB: 06-20-1945, 77 y.o.   MRN: [id number] Patient Care Team: Kandyce Rud, MD as PCP - General (Family Medicine) Salena Saner, MD as Consulting Physician (Pulmonary Disease)  Chief Complaint  Patient presents with   Follow-up    SOB with exertion. Wheezing. Cough with yellowish. ED on 07/05/2022 with COPD exacerbation.    HPI Patient is a 77 year old former smoker (50 PY) with a history as noted below, presents for follow-up on the issue of shortness of breath and respiratory failure with hypoxia.  I initially evaluated the patient on 08 February 2022 during a hospital admission.  Most recently she has been on 03 May 2022 and at that time she was requalified for ongoing oxygen supplementation with ambulatory oximetry.  Patient was instructed to continue oxygen supplementation and Trelegy.  A high-resolution chest CT was also ordered due to suspicion of underlying ILD.  Recall, the patient was admitted to [facility] on 07 February 2022 due to shortness of breath.  She was noted to be hypoxic after a CT angio chest was performed as an outpatient and therefore referred to the ED for further evaluation.  Pulse oximetry on room air was 75%.  She had recounted a prior COVID-19 diagnosis on 26 November 2021 having had a "hard time with it" and noticing increased dyspnea since then.  The patient however did also endorse that she had had gradual increase of dyspnea since August 2022 but became more noticeable after her COVID diagnosis.  CT angio chest performed at that time showed no pulmonary embolism however there was nodular/consolidative airspace opacity of the inferior right upper lobe abutting the minor fissure with adjacent groundglass.  This was presumed to be infectious/inflammatory.  A follow-up CT in 3 months time was recommended.  Also some other incidental nodules with groundglass appearance noted.  At that time also patient had a 2D echo  that shows LVEF of 40 to 45% with mild decrease in LV function and diastolic dysfunction grade 1.  She has mild mitral regurgitation as well.She has significant history of coronary artery disease status post MI with PCI in 2013, she quit smoking then.  She has been followed by Dr. Lady Gary for her cardiac issues.    At her 7 December visit her main complaints were of a sinus infection and nasal congestion.  She also had been noticing epistaxis with Flonase that had been prescribed elsewhere.  She was treated with Augmentin.  She was instructed to continue taper of prednisone that had been started during her hospitalization.  She was instructed on nasal hygiene with nasal saline. Her oxygen requirement was noted to be down to 2 L/min.  He is currently using 2 L with sleep and quiet activity and 3 L when ambulating due to using a POC during ambulation set to intermittent flow.   At her 03 May 2022 visit her only complaint is that her right ear felt "blocked".  At visit, she had to go to the emergency room on 5 April due to increased cough, increased shortness of breath and malaise.  Her oxygen saturations were also decreasing to 85% even with her oxygen supplementation.  She was evaluated and a chest x-ray failed to show any active pneumonia.  She has been treated with prednisone and doxycycline which she is completing now.  She feels markedly better.  She had been expectorating green sputum however this is clearing.  No hemoptysis.  Since  that evaluation she has had no fevers, chills or sweats.  Cough continues to be productive of whitish to pale yellow sputum, this has been chronic.  No chest pain, no orthopnea, no paroxysmal nocturnal dyspnea.  No lower extremity edema or calf tenderness.  Previously noted epistaxis has resolved.  She is compliant with Trelegy Ellipta 100.  Uses albuterol rescue perhaps once a week if that much.  No need for nocturnal use of rescue inhaler.   She had a high-resolution chest  CT on 24 June 2022 that showed a pulmonary parenchymal pattern of fibrosis progressed from November 2023 but minimally progressed to stable from December 2023.  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  The findings are indeterminate of UIP.  Patient does tell me today that she did work in the [employer] and used to [employer].  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  Did not use any respirator protection.  We had discussed these results with the patient previously.  Obtain hypersensitivity pneumonitis panel which was negative.  DATA 02/07/2022 CT angio chest: No PE, nodular consolidative airspace opacity in the inferior right upper lobe abutting the minor fissure measuring 2.4 x 1.9 cm with adjacent groundglass.  Recommend follow-up CT in 3 months.  Cardiomegaly and coronary artery disease, gross enlargement of the main pulmonary artery as can be seen in pulmonary hypertension.  Small pericardial effusion.  Emphysema. 02/08/2022 echocardiogram: LVEF 40 to 45%, mild decreased function of the LV.  No regional wall motion abnormalities. 03/21/2022 CT chest without contrast: Significant improvement of the right upper lobe consolidation seen previously with only minimal residual linear opacity noted.  Findings consistent with postinflammatory scarring.  1 additional follow-up evaluation in 3 to 6 months recommended.  Ultibro bilateral pulmonary nodules largest measuring 5 mm within the right upper and right lower lobe. 04/25/2022 PFTs: FEV1 1.57 L or 89% predicted, FVC 1.87 L or 78% predicted, FEV1 FVC is 84%, lung volumes are mildly reduced, diffusion capacity severely reduced. 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  Findings not consistent with UIP.    Review of Systems A 10 point review of systems was performed and it is as noted above otherwise negative.  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   Ischemic cardiomyopathy 02/15/2020   OA (osteoarthritis) of knee 01/18/2019   Carotid disease, bilateral 01/23/2016   Essential hypertension 01/23/2016   Generalized anxiety disorder 09/14/2013   Impaired fasting glucose 09/14/2013   Hematuria 06/09/2012   CAD (coronary artery disease) 01/23/2012   Social History   Tobacco Use   Smoking status: Former    Packs/day: 1    Types: Cigarettes    Quit date: 01/22/2012    Years since quitting: 10.4   Smokeless tobacco: Never   Tobacco comments:    50   Substance Use Topics   Alcohol use: Yes    Comment: occas  Allergies  Allergen Reactions   Ace Inhibitors Other (See Comments)    Mouth ulcers   Lipitor [Atorvastatin] Diarrhea   Lisinopril Itching   Simvastatin Diarrhea   Corticosteroids Other (See Comments)    Patient feels flushed while taking the medication however, can tolerate it.  She has never developed a rash with the medication.   Statins Diarrhea   Current Meds  Medication Sig   albuterol (VENTOLIN HFA) 108 (90 Base) MCG/ACT inhaler INHALE 2 PUFFS BY MOUTH EVERY 6 HOURS AS NEEDED FOR WHEEZE   ALPRAZolam (XANAX) 0.5 MG tablet Take 0.25 mg by mouth at bedtime.    citalopram (CELEXA) 20 MG tablet Take 20 mg by mouth daily.    doxycycline (VIBRAMYCIN) 100 MG capsule Take 1 capsule (100 mg total) by mouth 2 (two)  times daily for 10 days.   estradiol (ESTRACE) 0.1 MG/GM vaginal cream INSERT 4 CLICKS (= 1ML) VAGINALLY AT BEDTIME TWO TIMES PER *WEEK* -REFILLS: 48HR NOTICE - CUSTOM COMPOUND   fluticasone (FLONASE) 50 MCG/ACT nasal spray Place 2 sprays into both nostrils daily.    Fluticasone-Umeclidin-Vilant (TRELEGY ELLIPTA) 100-62.5-25 MCG/ACT AEPB TAKE 1 PUFF BY MOUTH EVERY DAY   furosemide (LASIX) 20 MG tablet Take 20 mg by mouth daily.   gabapentin (NEURONTIN) 300 MG capsule Take 300-600 mg by mouth See admin instructions. 300 mg in morning, 300 mg in afternoon and 600 mg  at night   loratadine (CLARITIN) 10 MG tablet Take 10 mg by mouth daily as needed.   Melatonin 3 MG TBDP Take 3 mg by mouth at bedtime.   metoprolol succinate (TOPROL-XL) 50 MG 24 hr tablet Take 50 mg by mouth daily.    MISCELLANEOUS VAGINAL PRODUCTS VA Place 1 application  vaginally at bedtime. INSERT 4 CLICKS (= 1ML) VAGINALLY AT BEDTIME TWO TIMES PER WEEK -REFILLS 48HR NOTICE - CUSTOM COMPOUND   montelukast (SINGULAIR) 10 MG tablet Take 10 mg by mouth at bedtime.   rosuvastatin (CRESTOR) 5 MG tablet Take 5 mg by mouth daily.   sacubitril-valsartan (ENTRESTO) 24-26 MG Take 1 tablet by mouth 2 (two) times daily.   spironolactone (ALDACTONE) 25 MG tablet Take 12.5 mg by mouth 2 (two) times daily.   traMADol (ULTRAM) 50 MG tablet Take 50 mg by mouth 2 (two) times daily as needed.   Immunization History  Administered Date(s) Administered   Fluad Quad(high Dose 65+) 01/30/2022   PNEUMOCOCCAL CONJUGATE-20 01/30/2022       Objective:   Physical Exam BP 138/84 (BP Location: Left Arm, Cuff Size: Normal)   Pulse (!) 58   Temp (!) 97.3 F (36.3 C)   Ht 5\' 1"  (1.549 m)   Wt 166 lb 12.8 oz (75.7 kg)   SpO2 95%   BMI 31.52 kg/m   SpO2: 95 % O2 Device: Nasal cannula O2 Flow Rate (L/min): 2 L/min O2 Type: Pulse O2  GENERAL: Obese woman, no acute distress, fully ambulatory, comfortable on nasal cannula O2.  No conversational  dyspnea. HEAD: Normocephalic, atraumatic.  EYES: Pupils equal, round, reactive to light.  No scleral icterus.  Ears: Cerumen impaction on the right.  Normal on left. MOUTH: Intact dentition, oral mucosa moist, pharynx clear. NECK: Supple. No thyromegaly. Trachea midline. No JVD.  No adenopathy. PULMONARY: Good air entry bilaterally.  Coarse, otherwise, no adventitious sounds. CHEST WALL: Mild tenderness along the left costochondral junction. CARDIOVASCULAR: S1 and S2. Regular rate and rhythm.  No rubs, murmurs or gallops heard. ABDOMEN: Obese,, normoactive bowel sounds, no tenderness, benign.  No hepatosplenomegaly. MUSCULOSKELETAL: No joint deformity, no clubbing, no edema.  NEUROLOGIC: No overt focal deficit. SKIN: Intact,warm,dry. PSYCH: Mood and behavior normal.  Lab Results  Component Value Date   NITRICOXIDE 22 07/11/2022       Assessment & Plan:     ICD-10-CM   1. ILD (interstitial lung disease)  J84.9    Appears to be more consistent with fibrotic HP Will need additional workup Will address on follow-up    2. COPD with chronic bronchitis and emphysema  J44.89    J43.9    She had a minor COPD flare Currently being treated Completing prednisone/doxycycline Trial of increased dose of Trelegy to 200    3. SOB (shortness of breath)  R06.02 Nitric oxide   Now back to baseline    4. Chronic respiratory failure with hypoxia  J96.11    Patient compliant with oxygen supplementation Continue oxygen at 2 L/min     Meds ordered this encounter  Medications   Fluticasone-Umeclidin-Vilant (TRELEGY ELLIPTA) 200-62.5-25 MCG/ACT AEPB    Sig: Inhale 1 puff into the lungs daily.    Dispense:  28 each    Refill:  0    Order Specific Question:   Lot Number?    Answer:   [id number]    Order Specific Question:   Expiration Date?    Answer:   12/31/2023    Order Specific Question:   Quantity    Answer:   2   Patient has a pattern of interstitial lung disease not consistent with UIP at  present.  Query occupational lung disease given her history of work in the [employer].  Will see the patient in follow-up in 3 to 4 weeks time she is to contact us prior to that time should any new difficulties arise.  Gailen Shelter, MD Advanced Bronchoscopy PCCM [facility]    *This note was dictated using voice recognition software/Dragon.  Despite best efforts to proofread, errors can occur which can change the meaning. Any transcriptional errors that result from this process are unintentional and may not be fully corrected at the time of dictation.

## 2022-07-18 ENCOUNTER — Ambulatory Visit (INDEPENDENT_AMBULATORY_CARE_PROVIDER_SITE_OTHER): Payer: Medicare HMO | Admitting: Dermatology

## 2022-07-18 VITALS — BP 97/65

## 2022-07-18 DIAGNOSIS — L578 Other skin changes due to chronic exposure to nonionizing radiation: Secondary | ICD-10-CM

## 2022-07-18 DIAGNOSIS — L814 Other melanin hyperpigmentation: Secondary | ICD-10-CM | POA: Diagnosis not present

## 2022-07-18 DIAGNOSIS — L570 Actinic keratosis: Secondary | ICD-10-CM | POA: Diagnosis not present

## 2022-07-18 DIAGNOSIS — L821 Other seborrheic keratosis: Secondary | ICD-10-CM | POA: Diagnosis not present

## 2022-07-18 NOTE — Patient Instructions (Addendum)
Recommend taking Heliocare sun protection supplement daily in sunny weather for additional sun protection. For maximum protection on the sunniest days, you can take up to 2 capsules of regular Heliocare OR take 1 capsule of Heliocare Ultra. For prolonged exposure (such as a full day in the sun), you can repeat your dose of the supplement 4 hours after your first dose. Heliocare can be purchased at [employer], at some [facility] or at [email or web address].   Some Self-Tanner Suggestions include - Sublime wipes by [employer]'oreal - [place] Mousse - Fake Bake  Recommend Arnica gel for bruising.  Due to recent changes in healthcare laws, you may see results of your pathology and/or laboratory studies on MyChart before the doctors have had a chance to review them. We understand that in some cases there may be results that are confusing or concerning to you. Please understand that not all results are received at the same time and often the doctors may need to interpret multiple results in order to provide you with the best plan of care or course of treatment. Therefore, we ask that you please give [place] 2 business days to thoroughly review all your results before contacting the office for clarification. Should we see a critical lab result, you will be contacted sooner.   If You Need Anything After Your Visit  If you have any questions or concerns for your doctor, please call our main line at [phone] and press option 4 to reach your doctor's medical assistant. If no one answers, please leave a voicemail as directed and we will return your call as soon as possible. Messages left after 4 pm will be answered the following business day.   You may also send [place] a message via MyChart. We typically respond to MyChart messages within 1-2 business days.  For prescription refills, please ask your pharmacy to contact our office. Our fax number is [phone].  If you have an urgent issue when the clinic is  closed that cannot wait until the next business day, you can page your doctor at the number below.    Please note that while we do our best to be available for urgent issues outside of office hours, we are not available 24/7.   If you have an urgent issue and are unable to reach [place], you may choose to seek medical care at your doctor's office, retail clinic, urgent care center, or emergency room.  If you have a medical emergency, please immediately call 911 or go to the emergency department.  Pager Numbers  - Dr. Gwen Pounds: [phone]  - Dr. Neale Burly: [phone]  - Dr. Roseanne Reno: [phone]  In the event of inclement weather, please call our main line at [phone] for an update on the status of any delays or closures.  Dermatology Medication Tips: Please keep the boxes that topical medications come in in order to help keep track of the instructions about where and how to use these. Pharmacies typically print the medication instructions only on the boxes and not directly on the medication tubes.   If your medication is too expensive, please contact our office at [phone] option 4 or send [place] a message through MyChart.   We are unable to tell what your co-pay for medications will be in advance as this is different depending on your insurance coverage. However, we may be able to find a substitute medication at lower cost or fill out paperwork to get insurance to cover a needed medication.   If a prior  authorization is required to get your medication covered by your insurance company, please allow [place] 1-2 business days to complete this process.  Drug prices often vary depending on where the prescription is filled and some pharmacies may offer cheaper prices.  The website www.goodrx.com contains coupons for medications through different pharmacies. The prices here do not account for what the cost may be with help from insurance (it may be cheaper with your insurance), but the website can  give you the price if you did not use any insurance.  - You can print the associated coupon and take it with your prescription to the pharmacy.  - You may also stop by our office during regular business hours and pick up a GoodRx coupon card.  - If you need your prescription sent electronically to a different pharmacy, notify our office through [facility] or by phone at [phone] option 4.     Si Usted Necesita Algo Despus de Su Visita  Tambin puede enviarnos un mensaje a travs de Clinical cytogeneticist. Por lo general respondemos a los mensajes de MyChart en el transcurso de 1 a 2 das hbiles.  Para renovar recetas, por favor pida a su farmacia que se ponga en contacto con nuestra oficina. Annie Sable de fax es [place] [phone].  Si tiene un asunto urgente cuando la clnica est cerrada y que no puede esperar hasta el siguiente da hbil, puede llamar/localizar a su doctor(a) al nmero que aparece a continuacin.   Por favor, tenga en cuenta que aunque hacemos todo lo posible para estar disponibles para asuntos urgentes fuera del horario de [place], no estamos disponibles las 24 horas del da, los 7 [address]  [address] de la [place].   Si tiene un problema urgente y no puede comunicarse con nosotros, puede optar por buscar atencin mdica  en el consultorio de su doctor(a), en una clnica privada, en un centro de atencin urgente o en una sala de emergencias.  Si tiene Engineer, drilling, por favor llame inmediatamente al 911 o vaya a la sala de emergencias.  Nmeros de bper  - Dr. Gwen Pounds: [phone]  - Dra. Moye: [phone]  - Dra. Roseanne Reno: [phone]  En caso de inclemencias del [place], por favor llame a Lacy Duverney principal al [phone] para una actualizacin sobre el [place] de cualquier retraso o cierre.  Consejos para la medicacin en dermatologa: Por favor, guarde las cajas en las que vienen los medicamentos de uso tpico para ayudarle a seguir las instrucciones sobre  dnde y cmo usarlos. Las farmacias generalmente imprimen las instrucciones del medicamento slo en las cajas y no directamente en los tubos del [place].   Si su medicamento es muy caro, por favor, pngase en contacto con Rolm Gala llamando al [phone] y presione la opcin 4 o envenos un mensaje a travs de Clinical cytogeneticist.   No podemos decirle cul ser su copago por los medicamentos por adelantado ya que esto es diferente dependiendo de la cobertura de su seguro. Sin embargo, es posible que podamos encontrar un medicamento sustituto a Audiological scientist un formulario para que el seguro cubra el medicamento que se considera necesario.   Si se requiere una autorizacin previa para que su compaa de seguros [place] su medicamento, por favor permtanos de 1 a 2 das hbiles para completar [address]  Los precios de los medicamentos varan con frecuencia dependiendo del Environmental consultant de dnde se surte la receta y alguna farmacias pueden ofrecer precios ms baratos.  El sitio web www.goodrx.com tiene cupones para  medicamentos de diferentes farmacias. Los precios aqu no tienen en cuenta lo que podra costar con la ayuda del seguro (puede ser ms barato con su seguro), pero el sitio web puede darle el precio si no utiliz ningn seguro.  - Puede imprimir el cupn correspondiente y llevarlo con su receta a la farmacia.  - Tambin puede pasar por nuestra oficina durante el horario de atencin regular y recoger una tarjeta de cupones de GoodRx.  - Si necesita que su receta se enve electrnicamente a una farmacia diferente, informe a nuestra oficina a travs de MyChart de [place] o por telfono llamando al [phone] y presione la opcin 4.  

## 2022-07-18 NOTE — Progress Notes (Signed)
   Follow-Up Visit   Subjective  Chelsea Fitzgerald is a 77 y.o. female who presents for the following: Actinic keratosis - previously tx with LN2, Left Dorsal Hand x2, mid chest x2, R-3 toe x1, right medial thigh x1, right dorsal hand x3, right forearm x3, right upper arm x1, right nasal sidewall x1. Pt here today for recheck. She also notes some other spots.  The following portions of the chart were reviewed this encounter and updated as appropriate: medications, allergies, medical history  Review of Systems:  No other skin or systemic complaints except as noted in HPI or Assessment and Plan.  Objective  Well appearing patient in no apparent distress; mood and affect are within normal limits.  A focused examination was performed of the following areas: The face, arms, and hands  Relevant exam findings are noted in the Assessment and Plan.  R dorsal hand x 1, L dorsal hand x 2 Erythematous thin papules/macules with gritty scale.    Assessment & Plan   AK (actinic keratosis) R dorsal hand x 1, L dorsal hand x 2  Hypertrophic at left dorsal hand x 2 AK vs excoriation at mid chest, recheck on follow up  Will plan to treat at follow up. Patient going on a cruise and would like to wait.  Actinic keratoses are precancerous spots that appear secondary to cumulative UV radiation exposure/sun exposure over time. They are chronic with expected duration over 1 year. A portion of actinic keratoses will progress to squamous cell carcinoma of the skin. It is not possible to reliably predict which spots will progress to skin cancer and so treatment is recommended to prevent development of skin cancer.  Recommend daily broad spectrum sunscreen SPF 30+ to sun-exposed areas, reapply every 2 hours as needed.  Recommend staying in the shade or wearing long sleeves, sun glasses (UVA+UVB protection) and wide brim hats (4-inch brim around the entire circumference of the hat). Call for new or changing  lesions.    ACTINIC DAMAGE - chronic, secondary to cumulative UV radiation exposure/sun exposure over time - diffuse scaly erythematous macules with underlying dyspigmentation - Recommend daily broad spectrum sunscreen SPF 30+ to sun-exposed areas, reapply every 2 hours as needed.  - Recommend staying in the shade or wearing long sleeves, sun glasses (UVA+UVB protection) and wide brim hats (4-inch brim around the entire circumference of the hat). - Call for new or changing lesions.  SEBORRHEIC KERATOSIS - Stuck-on, waxy, tan-brown papules and/or plaques  - Benign-appearing - Discussed benign etiology and prognosis. - Observe - Call for any changes  LENTIGINES Exam: scattered tan macules Due to sun exposure Treatment Plan: Benign-appearing, observe. Recommend daily broad spectrum sunscreen SPF 30+ to sun-exposed areas, reapply every 2 hours as needed.  Call for any changes  Return in about 6 months (around 01/17/2023) for TBSE, first available to treat AK's.  Maylene Roes, CMA, am acting as scribe for Darden Dates, MD .  Documentation: I have reviewed the above documentation for accuracy and completeness, and I agree with the above.  Darden Dates, MD

## 2022-07-18 NOTE — Progress Notes (Deleted)
   Follow-Up Visit   Subjective  Chelsea Fitzgerald is a 77 y.o. female who presents for the following: Skin Cancer Screening and Full Body Skin Exam  The patient presents for Total-Body Skin Exam (TBSE) for skin cancer screening and mole check. The patient has spots, moles and lesions to be evaluated, some may be new or changing and the patient has concerns that these could be cancer.    The following portions of the chart were reviewed this encounter and updated as appropriate: medications, allergies, medical history  Review of Systems:  No other skin or systemic complaints except as noted in HPI or Assessment and Plan.  Objective  Well appearing patient in no apparent distress; mood and affect are within normal limits.  A full examination was performed including scalp, head, eyes, ears, nose, lips, neck, chest, axillae, abdomen, back, buttocks, bilateral upper extremities, bilateral lower extremities, hands, feet, fingers, toes, fingernails, and toenails. All findings within normal limits unless otherwise noted below.   Relevant physical exam findings are noted in the Assessment and Plan.    Assessment & Plan   LENTIGINES, SEBORRHEIC KERATOSES, HEMANGIOMAS - Benign normal skin lesions - Benign-appearing - Call for any changes  MELANOCYTIC NEVI - Tan-brown and/or pink-flesh-colored symmetric macules and papules - Benign appearing on exam today - Observation - Call clinic for new or changing moles - Recommend daily use of broad spectrum spf 30+ sunscreen to sun-exposed areas.   ACTINIC DAMAGE - Chronic condition, secondary to cumulative UV/sun exposure - diffuse scaly erythematous macules with underlying dyspigmentation - Recommend daily broad spectrum sunscreen SPF 30+ to sun-exposed areas, reapply every 2 hours as needed.  - Staying in the shade or wearing long sleeves, sun glasses (UVA+UVB protection) and wide brim hats (4-inch brim around the entire circumference of the hat)  are also recommended for sun protection.  - Call for new or changing lesions.  SKIN CANCER SCREENING PERFORMED TODAY.     No follow-ups on file.  Maylene Roes, CMA, am acting as scribe for Darden Dates, MD .  Documentation: I have reviewed the above documentation for accuracy and completeness, and I agree with the above.  Darden Dates, MD

## 2022-07-25 ENCOUNTER — Other Ambulatory Visit: Payer: Self-pay | Admitting: Pulmonary Disease

## 2022-08-06 ENCOUNTER — Ambulatory Visit (INDEPENDENT_AMBULATORY_CARE_PROVIDER_SITE_OTHER): Payer: Medicare HMO | Admitting: Pulmonary Disease

## 2022-08-06 ENCOUNTER — Encounter: Payer: Self-pay | Admitting: Pulmonary Disease

## 2022-08-06 VITALS — BP 110/70 | HR 66 | Temp 97.1°F | Ht 61.0 in | Wt 165.2 lb

## 2022-08-06 DIAGNOSIS — J849 Interstitial pulmonary disease, unspecified: Secondary | ICD-10-CM

## 2022-08-06 DIAGNOSIS — J4489 Other specified chronic obstructive pulmonary disease: Secondary | ICD-10-CM

## 2022-08-06 DIAGNOSIS — J9611 Chronic respiratory failure with hypoxia: Secondary | ICD-10-CM | POA: Diagnosis not present

## 2022-08-06 DIAGNOSIS — J439 Emphysema, unspecified: Secondary | ICD-10-CM | POA: Diagnosis not present

## 2022-08-06 DIAGNOSIS — R0602 Shortness of breath: Secondary | ICD-10-CM

## 2022-08-06 MED ORDER — MOMETASONE FUROATE 50 MCG/ACT NA SUSP: 1.0000 | Freq: Two times a day (BID) | NASAL | 3 refills | Status: DC

## 2022-08-06 MED ORDER — TRELEGY ELLIPTA 100-62.5-25 MCG/ACT IN AEPB: 1.0000 | INHALATION_SPRAY | Freq: Every day | RESPIRATORY_TRACT | 11 refills | Status: DC

## 2022-08-06 MED ORDER — TRELEGY ELLIPTA 200-62.5-25 MCG/ACT IN AEPB: 1.0000 | INHALATION_SPRAY | Freq: Every day | RESPIRATORY_TRACT | 11 refills | Status: DC

## 2022-08-06 NOTE — Patient Instructions (Addendum)
We have sent prescriptions to your pharmacy for a nasal spray and for your Trelegy.  You will need oxygen at 2 L/min.  We are going to refer you to pulmonary rehab.  We will see you in follow-up in 3 months time call sooner should any new problems arise.

## 2022-08-06 NOTE — Progress Notes (Unsigned)
Subjective:    Patient ID: Chelsea Fitzgerald, female    DOB: June 13, 1945, 77 y.o.   MRN: [id number] Patient Care Team: Kandyce Rud, MD as PCP - General (Family Medicine) Salena Saner, MD as Consulting Physician (Pulmonary Disease)  Chief Complaint  Patient presents with   Follow-up    SOB with exertion. Cough with yellow sputum in the mornings. Occasional wheezing.     HPI Patient is a 77 year old former smoker (50 PVY) with a history as noted below who presents for follow-up on the issue of shortness of breath and respiratory failure with hypoxia.  This is a scheduled visit.  She was last seen on 11 July 2022.  For the details of that visit please refer to that note.  Since her prior visit she has not had any further issues with COPD exacerbations.  She is compliant with Trelegy Ellipta and as needed albuterol.  At her prior visit we increased her Trelegy to the 200 dose and feels that this has made slight improvement on her symptoms of dyspnea.  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  She has had no fevers, chills or sweats.  Cough continues to be productive of whitish to pale yellow sputum, this has been chronic.  No chest pain, no orthopnea, no paroxysmal nocturnal dyspnea.  No lower extremity edema or calf tenderness.   She states that she is going on a cruise over the next few weeks and will need a letter stating that she is on oxygen.  We also need to reassess her oxygen requirements today.   DATA 02/07/2022 CT angio chest: No PE, nodular consolidative airspace opacity in the inferior right upper lobe abutting the minor fissure measuring 2.4 x 1.9 cm with adjacent groundglass.  Recommend follow-up CT in 3 months.  Cardiomegaly and coronary  artery disease, gross enlargement of the main pulmonary artery as can be seen in pulmonary hypertension.  Small pericardial effusion.  Emphysema. 02/08/2022 echocardiogram: LVEF 40 to 45%, mild decreased function of the LV.  No regional wall motion abnormalities. 03/21/2022 CT chest without contrast: Significant improvement of the right upper lobe consolidation seen previously with only minimal residual linear opacity noted.  Findings consistent with postinflammatory scarring.  1 additional follow-up evaluation in 3 to 6 months recommended.  Ultibro bilateral pulmonary nodules largest measuring 5 mm within the right upper and right lower lobe. 04/25/2022 PFTs: FEV1 1.57 L or 89% predicted, FVC 1.87 L or 78% predicted, FEV1 FVC is 84%, lung volumes are mildly reduced, diffusion capacity severely reduced. 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  Findings not consistent with UIP.    Review of Systems A 10 point review of systems was performed and it is as noted above otherwise negative.  Patient Active Problem List   Diagnosis Date Noted   Acute respiratory failure with hypoxia (HCC) 02/07/2022   Abnormal CT scan, chest 02/07/2022   Acute respiratory failure with hypoxemia (HCC) 02/07/2022   Cecal polyp 06/21/2021   Hx of adenomatous colonic polyps 06/21/2021   Abnormal colonoscopy 06/21/2021   Adenomatous polyp of ascending colon    Adenoma of descending colon    Type 2 diabetes mellitus with peripheral  neuropathy (HCC) 05/05/2020   Ischemic cardiomyopathy 02/15/2020   OA (osteoarthritis) of knee 01/18/2019   Carotid disease, bilateral (HCC) 01/23/2016   Essential hypertension 01/23/2016   Generalized anxiety disorder 09/14/2013   Impaired fasting glucose 09/14/2013   Hematuria 06/09/2012   CAD (coronary artery  disease) 01/23/2012   Social History   Tobacco Use   Smoking status: Former    Packs/day: 1    Types: Cigarettes    Quit date: 01/22/2012    Years since quitting: 10.5   Smokeless tobacco: Never   Tobacco comments:    50   Substance Use Topics   Alcohol use: Yes    Comment: occas   Allergies  Allergen Reactions   Ace Inhibitors Other (See Comments)    Mouth ulcers   Lipitor [Atorvastatin] Diarrhea   Lisinopril Itching   Simvastatin Diarrhea   Corticosteroids Other (See Comments)    Patient feels flushed while taking the medication however, can tolerate it.  She has never developed a rash with the medication.   Statins Diarrhea   Current Meds  Medication Sig   albuterol (VENTOLIN HFA) 108 (90 Base) MCG/ACT inhaler INHALE 2 PUFFS BY MOUTH EVERY 6 HOURS AS NEEDED FOR WHEEZE   ALPRAZolam (XANAX) 0.5 MG tablet Take 0.25 mg by mouth at bedtime.    citalopram (CELEXA) 20 MG tablet Take 20 mg by mouth daily.    estradiol (ESTRACE) 0.1 MG/GM vaginal cream INSERT 4 CLICKS (= ) VAGINALLY AT BEDTIME TWO TIMES PER *WEEK* -REFILLS: 48HR NOTICE - CUSTOM COMPOUND   fluticasone (FLONASE) 50 MCG/ACT nasal spray Place 2 sprays into both nostrils daily.    Fluticasone-Umeclidin-Vilant (TRELEGY ELLIPTA) 200-62.5-25 MCG/ACT AEPB Inhale 1 puff into the lungs daily.   furosemide (LASIX) 20 MG tablet Take 20 mg by mouth daily.   gabapentin (NEURONTIN) 300 MG capsule Take 300-600 mg by mouth See admin instructions. 300 mg in morning, 300 mg in afternoon and 600 mg  at night   loratadine (CLARITIN) 10 MG tablet Take 10 mg by mouth daily as needed.   Melatonin 3 MG TBDP Take 3 mg by mouth at bedtime.   metoprolol succinate (TOPROL-XL) 50 MG 24 hr tablet Take 50 mg by mouth daily.    MISCELLANEOUS VAGINAL PRODUCTS VA Place 1 application  vaginally at bedtime. INSERT 4 CLICKS (= ) VAGINALLY AT BEDTIME TWO TIMES PER WEEK -REFILLS 48HR NOTICE - CUSTOM COMPOUND   montelukast (SINGULAIR) 10 MG tablet  Take 10 mg by mouth at bedtime.   rosuvastatin (CRESTOR) 5 MG tablet Take 5 mg by mouth daily.   sacubitril-valsartan (ENTRESTO) 24-26 MG Take 1 tablet by mouth 2 (two) times daily.   spironolactone (ALDACTONE) 25 MG tablet Take 12.5 mg by mouth 2 (two) times daily.   Immunization History  Administered Date(s) Administered   Fluad Quad(high Dose 65+) 01/30/2022   PNEUMOCOCCAL CONJUGATE-20 01/30/2022       Objective:   Physical Exam BP 110/70 (BP Location: Left Arm, Cuff Size: Normal)   Pulse 66   Temp (!) 97.1 F (36.2 C)   Ht 5\' 1"  (1.549 m)   Wt 165 lb 3.2 oz (74.9 kg)   SpO2 99%   BMI 31.21 kg/m   SpO2: 99 % O2 Device: Nasal cannula O2 Flow Rate (L/min): 2 L/min O2 Type: Pulse O2  GENERAL: Obese woman, no acute distress, fully ambulatory, comfortable on nasal cannula O2.  No conversational dyspnea. HEAD: Normocephalic, atraumatic.  EYES: Pupils equal, round, reactive to light.  No scleral  icterus.  Ears: Cerumen impaction on the right.  Normal on left. MOUTH: Intact dentition, oral mucosa moist, pharynx clear. NECK: Supple. No thyromegaly. Trachea midline. No JVD.  No adenopathy. PULMONARY: Good air entry bilaterally.  No adventitious sounds. CHEST WALL: Mild tenderness along the left costochondral junction. CARDIOVASCULAR: S1 and S2. Regular rate and rhythm.  No rubs, murmurs or gallops heard. ABDOMEN: Obese,, normoactive bowel sounds, no tenderness, benign.  No hepatosplenomegaly. MUSCULOSKELETAL: No joint deformity, no clubbing, no edema.  NEUROLOGIC: No overt focal deficit. SKIN: Intact,warm,dry. PSYCH: Mood and behavior normal.   Ambulatory oximetry was performed today: On room air resting O2 sat was 96%, after 2 laps (500 feet) O2 sats 85%, patient provided 1 L/min O2 could not maintain sats above 87% with ambulation, on 2 L patient was able to maintain saturations at 91% or better.  Patient will need 2 L/min of oxygen supplementation with ambulation.      Assessment & Plan:     ICD-10-CM   1. ILD (interstitial lung disease) (HCC)  J84.9 AMB referral to pulmonary rehabilitation   Not UIP Postinflammatory pulmonary fibrosis Other possibility: Fibrotic hypersensitivity pneumonitis    2. COPD with chronic bronchitis and emphysema (HCC)  J44.89 AMB referral to pulmonary rehabilitation   J43.9    Continue Trelegy 200, 1 puff daily Continue as needed albuterol    3. Chronic respiratory failure with hypoxia (HCC)  J96.11 AMB referral to pulmonary rehabilitation   Continue oxygen at 2 L/min O2 titration performed today Patient compliant with therapy    4. SOB (shortness of breath)  R06.02    Slow improvement Referral to pulmonary rehab     Orders Placed This Encounter  Procedures   AMB referral to pulmonary rehabilitation    Referral Priority:   Routine    Referral Type:   Consultation    Referral Reason:   Specialty Services Required    Number of Visits Requested:   1   Meds ordered this encounter  Medications   mometasone (NASONEX) 50 MCG/ACT nasal spray    Sig: Place 1 spray into the nose 2 (two) times daily.    Dispense:  1 each    Refill:  3   Fluticasone-Umeclidin-Vilant (TRELEGY ELLIPTA) 200-62.5-25 MCG/ACT AEPB    Sig: Inhale 1 puff into the lungs daily.    Dispense:  28 each    Refill:  11    Order Specific Question:   Lot Number?    Answer:   [id number]    Order Specific Question:   Expiration Date?    Answer:   12/31/2023    Order Specific Question:   Quantity    Answer:   2   Fluticasone-Umeclidin-Vilant (TRELEGY ELLIPTA) 100-62.5-25 MCG/ACT AEPB    Sig: Inhale 1 puff into the lungs daily.    Dispense:  28 each    Refill:  11   We will see the patient in follow-up in 3 months time she is to contact us prior to that time should any new problems arise.  Gailen Shelter, MD Advanced Bronchoscopy PCCM [facility]    *This note was dictated using voice recognition software/Dragon.  Despite best  efforts to proofread, errors can occur which can change the meaning. Any transcriptional errors that result from this process are unintentional and may not be fully corrected at the time of dictation.

## 2022-08-20 ENCOUNTER — Encounter: Payer: Self-pay | Admitting: Dermatology

## 2022-08-20 ENCOUNTER — Ambulatory Visit: Payer: Medicare HMO | Admitting: Dermatology

## 2022-08-20 DIAGNOSIS — D23112 Other benign neoplasm of skin of right lower eyelid, including canthus: Secondary | ICD-10-CM

## 2022-08-20 DIAGNOSIS — L570 Actinic keratosis: Secondary | ICD-10-CM | POA: Diagnosis not present

## 2022-08-20 DIAGNOSIS — D2310 Other benign neoplasm of skin of unspecified eyelid, including canthus: Secondary | ICD-10-CM

## 2022-08-20 NOTE — Progress Notes (Signed)
Follow-Up Visit   Subjective  Chelsea Fitzgerald is a 77 y.o. female who presents for the following: treat AK's at hands. Spots not treated at last appt because pt was going on a cruise. She does have a spot under her right eye to check.    The following portions of the chart were reviewed this encounter and updated as appropriate: medications, allergies, medical history  Review of Systems:  No other skin or systemic complaints except as noted in HPI or Assessment and Plan.  Objective  Well appearing patient in no apparent distress; mood and affect are within normal limits.   A focused examination was performed of the following areas: Hands, chest, face  Relevant exam findings are noted in the Assessment and Plan.  R dorsal hand x 1, L dorsal hand x 2, left chest x 1 (4) Erythematous thin papules/macules with gritty scale.   Right Eye Compressible translucent papule    Assessment & Plan     AK (actinic keratosis) (4) R dorsal hand x 1, L dorsal hand x 2, left chest x 1  Actinic keratoses are precancerous spots that appear secondary to cumulative UV radiation exposure/sun exposure over time. They are chronic with expected duration over 1 year. A portion of actinic keratoses will progress to squamous cell carcinoma of the skin. It is not possible to reliably predict which spots will progress to skin cancer and so treatment is recommended to prevent development of skin cancer.  Recommend daily broad spectrum sunscreen SPF 30+ to sun-exposed areas, reapply every 2 hours as needed.  Recommend staying in the shade or wearing long sleeves, sun glasses (UVA+UVB protection) and wide brim hats (4-inch brim around the entire circumference of the hat). Call for new or changing lesions.  Prior to procedure, discussed risks of blister formation, small wound, skin dyspigmentation, or rare scar following cryotherapy. Recommend Vaseline ointment to treated areas while healing.   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 Advised that after incision and drainage, it will likely come back over time. Pt prefers I&D today.  Incision and Drainage - Right Eye Location: right lower eyelid  Informed Consent: Discussed risks (permanent scarring, light or dark discoloration, infection, pain, bleeding, bruising, redness, damage to adjacent structures, and recurrence of the lesion) and benefits of the procedure, as well as the alternatives.  Informed consent was obtained.  Preparation: The area was prepped with alcohol.  Anesthesia: Lidocaine 1% with epinephrine  Procedure Details: An incision was made overlying the lesion. The lesion drained clear, mucoid fluid.  A small amount of fluid was drained.    Antibiotic ointment and a sterile pressure dressing were applied. The patient tolerated procedure well.  Total number of lesions drained: 1  Plan: The patient was instructed on post-op care. Recommend OTC analgesia as needed for pain.     Return for TBSE, as scheduled.  Anise Salvo, RMA, am acting as scribe for Darden Dates, MD .   Documentation: I have reviewed the above documentation for accuracy and completeness, and I agree with the above.  Darden Dates, MD

## 2022-08-20 NOTE — Patient Instructions (Addendum)
Cryotherapy Aftercare  Wash gently with soap and water everyday.   Apply Vaseline and Band-Aid daily until healed.    Due to recent changes in healthcare laws, you may see results of your pathology and/or laboratory studies on MyChart before the doctors have had a chance to review them. We understand that in some cases there may be results that are confusing or concerning to you. Please understand that not all results are received at the same time and often the doctors may need to interpret multiple results in order to provide you with the best plan of care or course of treatment. Therefore, we ask that you please give us 2 business days to thoroughly review all your results before contacting the office for clarification. Should we see a critical lab result, you will be contacted sooner.   If You Need Anything After Your Visit  If you have any questions or concerns for your doctor, please call our main line at [phone] and press option 4 to reach your doctor's medical assistant. If no one answers, please leave a voicemail as directed and we will return your call as soon as possible. Messages left after 4 pm will be answered the following business day.   You may also send us a message via MyChart. We typically respond to MyChart messages within 1-2 business days.  For prescription refills, please ask your pharmacy to contact our office. Our fax number is [phone].  If you have an urgent issue when the clinic is closed that cannot wait until the next business day, you can page your doctor at the number below.    Please note that while we do our best to be available for urgent issues outside of office hours, we are not available 24/7.   If you have an urgent issue and are unable to reach us, you may choose to seek medical care at your doctor's office, retail clinic, urgent care center, or emergency room.  If you have a medical emergency, please immediately call 911 or go to the emergency  department.  Pager Numbers  - Dr. Kowalski: [phone]  - Dr. Moye: [phone]  - Dr. Stewart: [phone]  In the event of inclement weather, please call our main line at [phone] for an update on the status of any delays or closures.  Dermatology Medication Tips: Please keep the boxes that topical medications come in in order to help keep track of the instructions about where and how to use these. Pharmacies typically print the medication instructions only on the boxes and not directly on the medication tubes.   If your medication is too expensive, please contact our office at [phone] option 4 or send us a message through MyChart.   We are unable to tell what your co-pay for medications will be in advance as this is different depending on your insurance coverage. However, we may be able to find a substitute medication at lower cost or fill out paperwork to get insurance to cover a needed medication.   If a prior authorization is required to get your medication covered by your insurance company, please allow us 1-2 business days to complete this process.  Drug prices often vary depending on where the prescription is filled and some pharmacies may offer cheaper prices.  The website www.goodrx.com contains coupons for medications through different pharmacies. The prices here do not account for what the cost may be with help from insurance (it may be cheaper with your insurance), but the website can give   you the price if you did not use any insurance.  - You can print the associated coupon and take it with your prescription to the pharmacy.  - You may also stop by our office during regular business hours and pick up a GoodRx coupon card.  - If you need your prescription sent electronically to a different pharmacy, notify our office through [facility] MyChart or by phone at [phone] option 4.    

## 2022-09-05 ENCOUNTER — Telehealth: Payer: Self-pay | Admitting: Pulmonary Disease

## 2022-09-05 NOTE — Telephone Encounter (Signed)
Per Tammy, we do not have any openings today. The patient should be seen at Urgent Care or ED. She will likely need a chest xray done.   I notified the patient. She will call her PCP and see if they can see her today. If not she will go to Urgent Care.

## 2022-09-05 NOTE — Telephone Encounter (Signed)
Patient states having symptoms of cough, chest congestion and fever. Pharmacy is [facility]. [address] . Patient phone number is [phone].

## 2022-09-05 NOTE — Telephone Encounter (Addendum)
I spoke with the patient. Symptoms for 1 week.  Cough with yellow to green sputum. This morning she did see some blood in it. Chills and sweats. She does feel like she has a fever but her forehead thermometer says it is 97.  Increased wheezing. O2 is at 91%- using 2L of O2  She said she "feels like someone has beat her up."  Took a home Covid test on Monday and Tuesday and they were both negative.  Using- Albuterol- twice a day, the last 2 days Trelegy- QD

## 2022-09-28 ENCOUNTER — Encounter: Payer: Self-pay | Admitting: Pulmonary Disease

## 2022-10-31 DEATH — deceased

## 2022-11-12 ENCOUNTER — Ambulatory Visit: Payer: Self-pay | Admitting: Pulmonary Disease

## 2023-01-21 ENCOUNTER — Ambulatory Visit: Payer: Medicare HMO | Admitting: Dermatology

## 2023-01-22 ENCOUNTER — Encounter: Payer: Medicare HMO | Admitting: Dermatology
